# Patient Record
Sex: Female | Born: 1950 | Race: White | Hispanic: No | Marital: Married | Smoking: Current every day smoker
Health system: Southern US, Community
[De-identification: ages and names within clinical notes are randomized; demographics above are authoritative.]

## PROBLEM LIST (undated history)

## (undated) DIAGNOSIS — T7840XA Allergy, unspecified, initial encounter: Secondary | ICD-10-CM

## (undated) DIAGNOSIS — I10 Essential (primary) hypertension: Secondary | ICD-10-CM

## (undated) HISTORY — DX: Essential (primary) hypertension: I10

## (undated) HISTORY — DX: Allergy, unspecified, initial encounter: T78.40XA

---

## 1995-07-11 HISTORY — PX: OTHER SURGICAL HISTORY: SHX169

## 2001-01-17 ENCOUNTER — Other Ambulatory Visit: Admission: RE | Admit: 2001-01-17 | Discharge: 2001-01-17 | Payer: Self-pay | Admitting: Family Medicine

## 2014-01-15 LAB — BASIC METABOLIC PANEL
BUN: 2 mg/dL — AB (ref 4–21)
Creatinine: 0.6 mg/dL (ref 0.5–1.1)
GLUCOSE: 102 mg/dL
POTASSIUM: 3.7 mmol/L (ref 3.4–5.3)
SODIUM: 141 mmol/L (ref 137–147)

## 2014-01-15 LAB — CBC AND DIFFERENTIAL
HCT: 38 % (ref 36–46)
HEMOGLOBIN: 14.1 g/dL (ref 12.0–16.0)
Platelets: 277 10*3/uL (ref 150–399)
WBC: 6.5 10*3/mL

## 2014-01-15 LAB — HEPATIC FUNCTION PANEL
ALT: 10 U/L (ref 7–35)
AST: 13 U/L (ref 13–35)

## 2014-04-05 LAB — HM PAP SMEAR: HM Pap smear: NEGATIVE

## 2014-10-26 DIAGNOSIS — J309 Allergic rhinitis, unspecified: Secondary | ICD-10-CM | POA: Diagnosis not present

## 2014-10-26 DIAGNOSIS — H9192 Unspecified hearing loss, left ear: Secondary | ICD-10-CM | POA: Diagnosis not present

## 2014-11-04 DIAGNOSIS — H6123 Impacted cerumen, bilateral: Secondary | ICD-10-CM | POA: Diagnosis not present

## 2014-11-04 DIAGNOSIS — J309 Allergic rhinitis, unspecified: Secondary | ICD-10-CM | POA: Diagnosis not present

## 2015-04-16 ENCOUNTER — Encounter: Payer: Self-pay | Admitting: Family Medicine

## 2015-07-27 ENCOUNTER — Ambulatory Visit (INDEPENDENT_AMBULATORY_CARE_PROVIDER_SITE_OTHER): Payer: Commercial Managed Care - HMO | Admitting: Family Medicine

## 2015-07-27 ENCOUNTER — Other Ambulatory Visit: Payer: Self-pay | Admitting: Family Medicine

## 2015-07-27 ENCOUNTER — Encounter: Payer: Self-pay | Admitting: Family Medicine

## 2015-07-27 VITALS — BP 132/80 | HR 87 | Temp 97.8°F | Resp 16 | Ht 68.0 in | Wt 152.0 lb

## 2015-07-27 DIAGNOSIS — Z0000 Encounter for general adult medical examination without abnormal findings: Secondary | ICD-10-CM | POA: Diagnosis not present

## 2015-07-27 DIAGNOSIS — F1011 Alcohol abuse, in remission: Secondary | ICD-10-CM | POA: Insufficient documentation

## 2015-07-27 DIAGNOSIS — R5383 Other fatigue: Secondary | ICD-10-CM | POA: Insufficient documentation

## 2015-07-27 DIAGNOSIS — R2231 Localized swelling, mass and lump, right upper limb: Secondary | ICD-10-CM

## 2015-07-27 DIAGNOSIS — L309 Dermatitis, unspecified: Secondary | ICD-10-CM | POA: Insufficient documentation

## 2015-07-27 DIAGNOSIS — K2100 Gastro-esophageal reflux disease with esophagitis, without bleeding: Secondary | ICD-10-CM | POA: Insufficient documentation

## 2015-07-27 DIAGNOSIS — I951 Orthostatic hypotension: Secondary | ICD-10-CM | POA: Insufficient documentation

## 2015-07-27 DIAGNOSIS — I10 Essential (primary) hypertension: Secondary | ICD-10-CM

## 2015-07-27 DIAGNOSIS — Z124 Encounter for screening for malignant neoplasm of cervix: Secondary | ICD-10-CM | POA: Diagnosis not present

## 2015-07-27 DIAGNOSIS — E7800 Pure hypercholesterolemia, unspecified: Secondary | ICD-10-CM | POA: Diagnosis not present

## 2015-07-27 DIAGNOSIS — R000 Tachycardia, unspecified: Secondary | ICD-10-CM | POA: Insufficient documentation

## 2015-07-27 DIAGNOSIS — R748 Abnormal levels of other serum enzymes: Secondary | ICD-10-CM | POA: Insufficient documentation

## 2015-07-27 DIAGNOSIS — R42 Dizziness and giddiness: Secondary | ICD-10-CM | POA: Insufficient documentation

## 2015-07-27 DIAGNOSIS — Z1211 Encounter for screening for malignant neoplasm of colon: Secondary | ICD-10-CM | POA: Diagnosis not present

## 2015-07-27 DIAGNOSIS — F17200 Nicotine dependence, unspecified, uncomplicated: Secondary | ICD-10-CM | POA: Insufficient documentation

## 2015-07-27 DIAGNOSIS — H9192 Unspecified hearing loss, left ear: Secondary | ICD-10-CM | POA: Insufficient documentation

## 2015-07-27 NOTE — Progress Notes (Signed)
Patient ID: Jonay Krzyzaniak, female   DOB: 12-25-1950, 65 y.o.   MRN: [id number]       Patient: Mariselda Crysler, Female    DOB: 1951/03/15, 65 y.o.   MRN: [id number] Visit Date: 07/27/2015  Today's Provider: Margarita Rana, MD   Chief Complaint  Patient presents with  . Medicare Wellness   Subjective:    Annual wellness visit Kaylene Shaddock is a 65 y.o. female. She feels well. She reports exercising none. She reports she is sleeping well.  Lab Results  Component Value Date   WBC 6.5 01/15/2014   HGB 14.1 01/15/2014   HCT 38 01/15/2014   PLT 277 01/15/2014   ALT 10 01/15/2014   AST 13 01/15/2014   NA 141 01/15/2014   K 3.7 01/15/2014   CREATININE 0.6 01/15/2014   BUN 2* 01/15/2014    -----------------------------------------------------------   Review of Systems  Constitutional: Negative.   HENT: Positive for rhinorrhea and sneezing.   Eyes: Negative.   Respiratory: Negative.   Cardiovascular: Negative.   Gastrointestinal: Negative.   Endocrine: Positive for polyuria.  Genitourinary: Negative.   Musculoskeletal: Negative.   Skin: Positive for rash.  Allergic/Immunologic: Positive for environmental allergies.  Neurological: Negative.   Hematological: Negative.   Psychiatric/Behavioral: Negative.     Social History   Social History  . Marital Status: Single    Spouse Name: N/A  . Number of Children: N/A  . Years of Education: N/A   Occupational History  . Not on file.   Social History Main Topics  . Smoking status: Current Every Day Smoker  . Smokeless tobacco: Never Used  . Alcohol Use: 12.6 oz/week    21 Cans of beer per week  . Drug Use: No  . Sexual Activity: Not on file   Other Topics Concern  . Not on file   Social History Narrative    Past Medical History  Diagnosis Date  . Allergy   . Hypertension      Patient Active Problem List   Diagnosis Date Noted  . Dizziness 07/27/2015  . Dermatitis, eczematoid 07/27/2015    . Abnormal liver enzymes 07/27/2015  . Fatigue 07/27/2015  . Esophagitis, reflux 07/27/2015  . H/O alcohol abuse 07/27/2015  . Hearing loss of left ear 07/27/2015  . Hypotension, postural 07/27/2015  . Fast heart beat 07/27/2015  . Compulsive tobacco user syndrome 07/27/2015  . Allergic rhinitis 03/16/2009  . Hypercholesteremia 03/30/2004  . BP (high blood pressure) 08/10/1993    Past Surgical History  Procedure Laterality Date  . Herniated nucleus pulposus  1997    Her family history includes CAD in her father; Cancer in her mother.    Previous Medications   ASPIRIN 81 MG TABLET    Take 1 tablet by mouth daily.   FLUTICASONE (FLONASE) 50 MCG/ACT NASAL SPRAY    Place 2 sprays into the nose as needed.    LORATADINE (CLARITIN) 10 MG TABLET    Take 1 tablet by mouth daily as needed.    METOPROLOL SUCCINATE (TOPROL-XL) 25 MG 24 HR TABLET    Take 1 tablet by mouth daily.   MONTELUKAST (SINGULAIR) 10 MG TABLET    Take 1 tablet by mouth as needed.     Patient Care Team: Margarita Rana, MD as PCP - General (Family Medicine)     Objective:   Vitals: BP 132/80 mmHg  Pulse 87  Temp(Src) 97.8 F (36.6 C) (Oral)  Resp 16  Ht 5\' 8"  (1.727 m)  Wt 152 lb (68.947 kg)  BMI 23.12 kg/m2  SpO2 97%  Physical Exam  Constitutional: She is oriented to person, place, and time. She appears well-developed and well-nourished.  HENT:  Head: Normocephalic and atraumatic.  Right Ear: Tympanic membrane, external ear and ear canal normal.  Left Ear: Tympanic membrane, external ear and ear canal normal.  Nose: Mucosal edema and rhinorrhea present.  Mouth/Throat: Uvula is midline, oropharynx is clear and moist and mucous membranes are normal.  Eyes: Conjunctivae, EOM and lids are normal. Pupils are equal, round, and reactive to light.  Neck: Trachea normal and normal range of motion. Neck supple. Carotid bruit is not present. No thyroid mass and no thyromegaly present.  Cardiovascular: Normal  rate, regular rhythm and normal heart sounds.   Pulmonary/Chest: Effort normal and breath sounds normal.  Abdominal: Soft. Normal appearance and bowel sounds are normal. There is no hepatosplenomegaly. There is no tenderness.  Genitourinary: No breast swelling, tenderness or discharge.  Musculoskeletal: Normal range of motion.       Right elbow: She exhibits swelling.  2 cm firm nodule  Lymphadenopathy:    She has no cervical adenopathy.    She has no axillary adenopathy.  Neurological: She is alert and oriented to person, place, and time. She has normal strength. No cranial nerve deficit.  Skin: Skin is warm, dry and intact. Rash noted.  Senile purpura  Psychiatric: She has a normal mood and affect. Her speech is normal and behavior is normal. Judgment and thought content normal. Cognition and memory are normal.    Activities of Daily Living In your present state of health, do you have any difficulty performing the following activities: 07/27/2015  Hearing? N  Vision? N  Difficulty concentrating or making decisions? N  Walking or climbing stairs? Y  Dressing or bathing? N  Doing errands, shopping? N    Fall Risk Assessment Fall Risk  07/27/2015  Falls in the past year? No     Depression Screen PHQ 2/9 Scores 07/27/2015  PHQ - 2 Score 0    Cognitive Testing - 6-CIT  Correct? Score   What year is it? yes 0 0 or 4  What month is it? yes 0 0 or 3  Memorize:    Pia Mau,  40,  [address]  [place],      What time is it? (within 1 hour) yes 0 0 or 3  Count backwards from 20 yes 0 0, 2, or 4  Name the months of the year yes 0 0, 2, or 4  Repeat name & address above yes 2 0, 2, 4, 6, 8, or 10       TOTAL SCORE  2/28   Interpretation:  Normal  Normal (0-7) Abnormal (8-28)       Assessment & Plan:     Annual Wellness Visit  Reviewed patient's Family Medical History Reviewed and updated list of patient's medical providers Assessment of cognitive impairment was  done Assessed patient's functional ability Established a written schedule for health screening [place] Completed and Reviewed  Exercise Activities and Dietary recommendations Goals    . Exercise 150 minutes per week (moderate activity)       1. Initial Medicare annual wellness visit Exam done today. EKG with no acute changes.  Has multiple lifestyle issues that encouraged her to address today, especially ETOH and tobacco. Also refused all vaccines today.  - EKG 12-Lead  2. Essential hypertension Condition is stable. Please continue current  medication and  plan of care as noted.   - CBC with Differential/Platelet - Comprehensive metabolic panel  3. Hypercholesteremia Will check labs.   - Lipid Panel With LDL/HDL Ratio - TSH  4. Cervical cancer screening Done today.  - Pap IG (Image Guided)  5. Colon cancer screening Refused colonoscopy but did agree to cologard.  - Cologuard  6. Skin lump of arm, right Slightly firm and irregular for simple cyst or bursitis. Will refer for further evaluation.   - Ambulatory referral to General Surgery   Patient was seen and examined by Jerrell Belfast, MD, and note scribed by Lynford Humphrey, [place].   I have reviewed the document for accuracy and completeness and I agree with above. Jerrell Belfast, MD   Margarita Rana, MD    ------------------------------------------------------------------------------------------------------------

## 2015-07-28 ENCOUNTER — Encounter: Payer: Self-pay | Admitting: *Deleted

## 2015-07-30 ENCOUNTER — Telehealth: Payer: Self-pay

## 2015-07-30 LAB — PAP IG (IMAGE GUIDED): PAP SMEAR COMMENT: 0

## 2015-07-30 NOTE — Telephone Encounter (Signed)
Pt advised.   Thanks,   -Dianara Smullen  

## 2015-07-30 NOTE — Telephone Encounter (Signed)
-----   Message from Margarita Rana, MD sent at 07/30/2015  1:44 PM EST ----- Pap is normal. Please notify patient.

## 2015-08-10 ENCOUNTER — Ambulatory Visit: Payer: Self-pay | Admitting: General Surgery

## 2015-08-16 ENCOUNTER — Ambulatory Visit (INDEPENDENT_AMBULATORY_CARE_PROVIDER_SITE_OTHER): Payer: Commercial Managed Care - HMO | Admitting: General Surgery

## 2015-08-16 ENCOUNTER — Encounter: Payer: Self-pay | Admitting: General Surgery

## 2015-08-16 VITALS — BP 128/72 | HR 76 | Resp 14 | Ht 68.0 in | Wt 148.0 lb

## 2015-08-16 DIAGNOSIS — M71321 Other bursal cyst, right elbow: Secondary | ICD-10-CM | POA: Diagnosis not present

## 2015-08-16 NOTE — Progress Notes (Signed)
Patient ID: Patricia Casey, female   DOB: 09/29/1950, 65 y.o.   MRN: [id number]  Chief Complaint  Patient presents with  . Other    lump on arm    HPI Patricia Casey is a 65 y.o. female here today for an evaluation of a lump on right elbow. Patient states the lump has been present for 3 months and increased in size for the first 2 months, none since then.. She noticed the area started out about a pea size. Denies injury to the area. No pain/tenderness. She does rest her elbow on her computer desk.  HPI  Past Medical History  Diagnosis Date  . Allergy   . Hypertension     Past Surgical History  Procedure Laterality Date  . Herniated nucleus pulposus  1997    Back surgery     Family History  Problem Relation Age of Onset  . Cancer Mother     Pancreatitic  . CAD Father     Social History Social History  Substance Use Topics  . Smoking status: Current Every Day Smoker  . Smokeless tobacco: Never Used  . Alcohol Use: 12.6 oz/week    21 Cans of beer per week    No Known Allergies  Current Outpatient Prescriptions  Medication Sig Dispense Refill  . aspirin 81 MG tablet Take 1 tablet by mouth daily.    . fluticasone (FLONASE) 50 MCG/ACT nasal spray Place 2 sprays into the nose daily.     Marland Kitchen loratadine (CLARITIN) 10 MG tablet Take 1 tablet by mouth daily as needed.     . metoprolol succinate (TOPROL-XL) 25 MG 24 hr tablet TAKE 1 TABLET EVERY DAY 90 tablet 1  . montelukast (SINGULAIR) 10 MG tablet Take 1 tablet by mouth as needed.      No current facility-administered medications for this visit.    Review of Systems Review of Systems  Constitutional: Negative.   Respiratory: Negative.   Cardiovascular: Negative.     Blood pressure 128/72, pulse 76, resp. rate 14, height 5\' 8"  (1.727 m), weight 148 lb (67.132 kg).  Physical Exam Physical Exam  Constitutional: She is oriented to person, place, and time. She appears well-developed and well-nourished.  Eyes:  Conjunctivae are normal. No scleral icterus.  Neck: Neck supple. No thyromegaly present.  Cardiovascular: Normal rate, regular rhythm and normal heart sounds.   Pulmonary/Chest: Effort normal and breath sounds normal.  Musculoskeletal:       Arms:  2 separate masses 1 1/2 x 2 1/2  and a 3 x 3 mass present right elbow    Lymphadenopathy:    She has no cervical adenopathy.  Neurological: She is alert and oriented to person, place, and time.  Skin: Skin is warm and dry.      Assessment    Likely cystic mass, possibly related to the underlying bursa.    Plan    The patient reports the area has not increased in size in the past month and is only symptomatic when she rests her elbow on the counter. Options at this time include observation versus excision.   Patient advised follow up in 3 months and  if it increases in size call the office.       This information has been scribed by Verlene Mayer, CMA   PCP: Dr. Illa Level, [place] 08/17/2015, 12:30 PM

## 2015-08-16 NOTE — Patient Instructions (Signed)
Follow up in 3 months and  if it increases in size call the office sooner.

## 2015-09-20 ENCOUNTER — Other Ambulatory Visit: Payer: Self-pay | Admitting: Family Medicine

## 2015-09-20 DIAGNOSIS — J3089 Other allergic rhinitis: Secondary | ICD-10-CM

## 2015-11-15 ENCOUNTER — Ambulatory Visit: Payer: Self-pay | Admitting: General Surgery

## 2015-11-25 ENCOUNTER — Other Ambulatory Visit: Payer: Self-pay | Admitting: Emergency Medicine

## 2015-11-25 ENCOUNTER — Ambulatory Visit (INDEPENDENT_AMBULATORY_CARE_PROVIDER_SITE_OTHER): Payer: Commercial Managed Care - HMO | Admitting: General Surgery

## 2015-11-25 ENCOUNTER — Encounter: Payer: Self-pay | Admitting: General Surgery

## 2015-11-25 VITALS — BP 140/70 | HR 74 | Resp 14 | Ht 68.0 in | Wt 151.0 lb

## 2015-11-25 DIAGNOSIS — M71321 Other bursal cyst, right elbow: Secondary | ICD-10-CM

## 2015-11-25 DIAGNOSIS — I10 Essential (primary) hypertension: Secondary | ICD-10-CM

## 2015-11-25 MED ORDER — METOPROLOL SUCCINATE ER 25 MG PO TB24: 25.0000 mg | ORAL_TABLET | Freq: Every day | ORAL | Status: DC

## 2015-11-25 NOTE — Progress Notes (Signed)
Patient ID: Patricia Casey, female   DOB: 1951/01/24, 65 y.o.   MRN: [id number]  Chief Complaint  Patient presents with  . Follow-up    skin cyst    HPI Patricia Casey is a 65 y.o. female here following up for a skin cyst of her right arm. She reports that the area has not changed. She reports that it is still very sore to touch.   No difficulty with right upper extremity range of motion.  I personal review the patient's history. HPI  Past Medical History  Diagnosis Date  . Allergy   . Hypertension     Past Surgical History  Procedure Laterality Date  . Herniated nucleus pulposus  1997    Back surgery     Family History  Problem Relation Age of Onset  . Cancer Mother     Pancreatitic  . CAD Father     Social History Social History  Substance Use Topics  . Smoking status: Current Every Day Smoker  . Smokeless tobacco: Never Used  . Alcohol Use: 12.6 oz/week    21 Cans of beer per week    No Known Allergies  Current Outpatient Prescriptions  Medication Sig Dispense Refill  . aspirin 81 MG tablet Take 1 tablet by mouth daily.    . fluticasone (FLONASE) 50 MCG/ACT nasal spray Place 2 sprays into the nose daily.     Marland Kitchen loratadine (CLARITIN) 10 MG tablet Take 1 tablet by mouth daily as needed.     . montelukast (SINGULAIR) 10 MG tablet TAKE 1 TABLET EVERY DAY 90 tablet 3  . metoprolol succinate (TOPROL-XL) 25 MG 24 hr tablet Take 1 tablet (25 mg total) by mouth daily. 30 tablet 0   No current facility-administered medications for this visit.    Review of Systems Review of Systems  Constitutional: Negative.   Respiratory: Negative.   Cardiovascular: Negative.     Blood pressure 140/70, pulse 74, resp. rate 14, height 5\' 8"  (1.727 m), weight 151 lb (68.493 kg).  Physical Exam Physical Exam  Constitutional: She is oriented to person, place, and time. She appears well-developed and well-nourished.  Eyes: Conjunctivae are normal. No scleral icterus.   Neck: Neck supple.  Cardiovascular: Normal rate, regular rhythm and normal heart sounds.   Pulmonary/Chest: Effort normal and breath sounds normal.  Musculoskeletal:       Arms: Lymphadenopathy:    She has no cervical adenopathy.    She has no axillary adenopathy.  Neurological: She is alert and oriented to person, place, and time.  Skin: Skin is warm and dry.  4 x 5 mass right elbow  Psychiatric: She has a normal mood and affect.    Data Reviewed At the time of her previous exam there appeared to be 2 discrete masses 3 x 3 and 1.5 x 1.5 cm.  Assessment    Bursa mass versus sebaceous cyst of the posterior elbow.    Plan    Due to the persistent discomfort patient has elected to proceed to excision. The procedure was reviewed. This be scheduled a convenient date.    PCP:  Margarita Rana This has been scribed by Lesly Rubenstein LPN    Robert Bellow 11/27/2015, 10:20 AM

## 2015-11-25 NOTE — Telephone Encounter (Signed)
Pt husband called and would like to know if pt can get a 2 week supply on Metoprolol succ. 25 mg because she thought that she had another bottle at home and she did not. She ordered her refill through mail order and will not get it for about 2 weeks. She is completely out and missed yesterday as well.     [facility] [address]

## 2015-11-25 NOTE — Telephone Encounter (Signed)
Please advise. Patricia Casey, CMA  

## 2015-12-07 ENCOUNTER — Encounter: Payer: Self-pay | Admitting: General Surgery

## 2015-12-07 ENCOUNTER — Ambulatory Visit (INDEPENDENT_AMBULATORY_CARE_PROVIDER_SITE_OTHER): Payer: Commercial Managed Care - HMO | Admitting: General Surgery

## 2015-12-07 VITALS — BP 118/66 | HR 70 | Resp 12 | Ht 68.0 in | Wt 150.0 lb

## 2015-12-07 DIAGNOSIS — C801 Malignant (primary) neoplasm, unspecified: Secondary | ICD-10-CM | POA: Diagnosis not present

## 2015-12-07 DIAGNOSIS — D492 Neoplasm of unspecified behavior of bone, soft tissue, and skin: Secondary | ICD-10-CM | POA: Diagnosis not present

## 2015-12-07 DIAGNOSIS — M71321 Other bursal cyst, right elbow: Secondary | ICD-10-CM

## 2015-12-07 DIAGNOSIS — C7641 Malignant neoplasm of right upper limb: Secondary | ICD-10-CM

## 2015-12-07 MED ORDER — HYDROCODONE-ACETAMINOPHEN 5-325 MG PO TABS: 1.0000 | ORAL_TABLET | ORAL | Status: DC | PRN

## 2015-12-07 NOTE — Patient Instructions (Addendum)
The patient is aware to call back for any questions or concerns. Ice pack to the area for comfort today Leave dressing in place for 2 days Shower after wrap is removed keep are clean Return for suture removal

## 2015-12-07 NOTE — Progress Notes (Signed)
Patient ID: Patricia Casey, female   DOB: 01/19/1951, 65 y.o.   MRN: [id number]  Chief Complaint  Patient presents with  . Procedure    excision right elbow mass    HPI Patricia Casey is a 65 y.o. female.  Here today for excision left elbow mass.  HPI  Past Medical History  Diagnosis Date  . Allergy   . Hypertension     Past Surgical History  Procedure Laterality Date  . Herniated nucleus pulposus  1997    Back surgery     Family History  Problem Relation Age of Onset  . Cancer Mother     Pancreatitic  . CAD Father     Social History Social History  Substance Use Topics  . Smoking status: Current Every Day Smoker  . Smokeless tobacco: Never Used  . Alcohol Use: 12.6 oz/week    21 Cans of beer per week    No Known Allergies  Current Outpatient Prescriptions  Medication Sig Dispense Refill  . aspirin 81 MG tablet Take 1 tablet by mouth daily.    . fluticasone (FLONASE) 50 MCG/ACT nasal spray Place 2 sprays into the nose daily.     Marland Kitchen loratadine (CLARITIN) 10 MG tablet Take 1 tablet by mouth daily as needed.     . metoprolol succinate (TOPROL-XL) 25 MG 24 hr tablet Take 1 tablet (25 mg total) by mouth daily. 30 tablet 0  . montelukast (SINGULAIR) 10 MG tablet TAKE 1 TABLET EVERY DAY 90 tablet 3  . HYDROcodone-acetaminophen (NORCO) 5-325 MG tablet Take 1-2 tablets by mouth every 4 (four) hours as needed for moderate pain. 20 tablet 0   No current facility-administered medications for this visit.    Review of Systems Review of Systems  Constitutional: Negative.   Respiratory: Negative.   Cardiovascular: Negative.     Blood pressure 118/66, pulse 70, resp. rate 12, height 5\' 8"  (1.727 m), weight 150 lb (68.04 kg).  Physical Exam Physical Exam  Constitutional: She is oriented to person, place, and time. She appears well-developed and well-nourished.  Musculoskeletal:       Arms: Neurological: She is alert and oriented to person, place, and time.   Skin: Skin is warm and dry.  Psychiatric: Her behavior is normal.      Assessment    Enlarging, symptomatic mass involving the left posterior elbow.    Plan    The area was cleansed with ChloraPrep. 20 mL of 0.5% Xylocaine with 0.25% Marcaine with 1-200,000 of epinephrine was utilized well tolerated. ChloraPrep was applied to the skin. A transverse incision was made over the mass which extended to the base of the dermis. The vast majority of the mass could be enucleated. Scant bleeding was noted. The deep tissue was approximately with interrupted 3-0 Vicryl sutures. The skin closed with a running 4-0 nylon suture. Telfa and Tegaderm dressing applied. Fluff gauze, Kerlix and a light Coban dressing placed.  Patient was instructed in regards to wound care.  Follow up in one week for nursing exam with physician oversight.     PCP:  Margarita Rana This information has been scribed by Karie Fetch RN, BSN,BC.   Robert Bellow 12/08/2015, 9:29 PM

## 2015-12-10 ENCOUNTER — Telehealth: Payer: Self-pay | Admitting: General Surgery

## 2015-12-10 NOTE — Telephone Encounter (Signed)
Message that the final report would not be out until Monday left on the patient's home answering machine

## 2015-12-13 ENCOUNTER — Ambulatory Visit: Payer: Self-pay

## 2015-12-14 ENCOUNTER — Ambulatory Visit (INDEPENDENT_AMBULATORY_CARE_PROVIDER_SITE_OTHER): Payer: Commercial Managed Care - HMO | Admitting: *Deleted

## 2015-12-14 DIAGNOSIS — M71321 Other bursal cyst, right elbow: Secondary | ICD-10-CM

## 2015-12-14 NOTE — Patient Instructions (Signed)
The patient is aware to call back for any questions or concerns.  

## 2015-12-14 NOTE — Progress Notes (Signed)
Patient ID: Patricia Casey, female   DOB: Jan 08, 1951, 65 y.o.   MRN: [id number]  Patient came in today for a wound check post excision.  The wound is clean, with no signs of infection noted. Sutures removed, steri stripes applied. Minimal bruising noted. Follow up as scheduled.

## 2015-12-16 ENCOUNTER — Encounter: Payer: Self-pay | Admitting: *Deleted

## 2015-12-20 ENCOUNTER — Encounter: Payer: Self-pay | Admitting: General Surgery

## 2015-12-20 ENCOUNTER — Ambulatory Visit (INDEPENDENT_AMBULATORY_CARE_PROVIDER_SITE_OTHER): Payer: Commercial Managed Care - HMO | Admitting: General Surgery

## 2015-12-20 ENCOUNTER — Telehealth: Payer: Self-pay | Admitting: *Deleted

## 2015-12-20 VITALS — BP 130/74 | HR 88 | Resp 14 | Ht 68.0 in | Wt 148.0 lb

## 2015-12-20 DIAGNOSIS — C801 Malignant (primary) neoplasm, unspecified: Secondary | ICD-10-CM

## 2015-12-20 NOTE — Progress Notes (Addendum)
Patient ID: Patricia Casey, female   DOB: May 02, 1951, 65 y.o.   MRN: [id number]  Chief Complaint  Patient presents with  . Follow-up    HPI Patricia Casey is a 65 y.o. female here today for her follow up for a right elbow excision done on 12/07/15. Patient states she is doing well. She states that it is still tender. No limitation of elbow function.  I personally reviewed the patient's history. HPI  Past Medical History  Diagnosis Date  . Allergy   . Hypertension     Past Surgical History  Procedure Laterality Date  . Herniated nucleus pulposus  1997    Back surgery     Family History  Problem Relation Age of Onset  . Cancer Mother     Pancreatitic  . CAD Father     Social History Social History  Substance Use Topics  . Smoking status: Current Every Day Smoker  . Smokeless tobacco: Never Used  . Alcohol Use: 12.6 oz/week    21 Cans of beer per week    No Known Allergies  Current Outpatient Prescriptions  Medication Sig Dispense Refill  . aspirin 81 MG tablet Take 1 tablet by mouth daily.    . fluticasone (FLONASE) 50 MCG/ACT nasal spray Place 2 sprays into the nose daily.     Marland Kitchen HYDROcodone-acetaminophen (NORCO) 5-325 MG tablet Take 1-2 tablets by mouth every 4 (four) hours as needed for moderate pain. 20 tablet 0  . loratadine (CLARITIN) 10 MG tablet Take 1 tablet by mouth daily as needed.     . metoprolol succinate (TOPROL-XL) 25 MG 24 hr tablet Take 1 tablet (25 mg total) by mouth daily. 30 tablet 0  . montelukast (SINGULAIR) 10 MG tablet TAKE 1 TABLET EVERY DAY 90 tablet 3   No current facility-administered medications for this visit.    Review of Systems Review of Systems  Constitutional: Negative.   Respiratory: Negative.   Cardiovascular: Negative.     Blood pressure 130/74, pulse 88, resp. rate 14, height 5\' 8"  (1.727 m), weight 148 lb (67.132 kg).  Physical Exam Physical Exam  Constitutional: She is oriented to person, place, and time.  She appears well-developed and well-nourished.  Eyes: Conjunctivae are normal. No scleral icterus.  Neck: Neck supple.  Cardiovascular: Normal rate, regular rhythm and normal heart sounds.   Pulmonary/Chest: Effort normal and breath sounds normal.  Lymphadenopathy:    She has no cervical adenopathy.  Neurological: She is alert and oriented to person, place, and time.  Skin: Skin is warm and dry.  Right elbow incision site is clean and healing well.  Data Reviewed PLEOMORPHIC SPINDLE CELL NEOPLASM,  I spoke with the pathologist. The invasion of the adipose tissue suggestive this is a malignant process.  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    Plan    We'll undertake a literature review. She is likely going need formal excision to clear margins. In this location this may be best handled by a soft tissue surgical oncologist.    Follow up appointment to be announced. PCP:  Margarita Rana  This information has been scribed by Gaspar Cola CMA.    Robert Bellow 12/20/2015, 9:27 PM   Patient called to report that she would like to be seen at [facility]. Will arrange for assessment with:   Kandis Nab, MD  Associate Professor of Orthopaedics  Program Director - [place] and Orthopaedic Surgery  Orthopaedic  Oncology

## 2015-12-20 NOTE — Patient Instructions (Addendum)
Follow up appointment to be announced.  

## 2015-12-20 NOTE — Telephone Encounter (Signed)
Patient called and wanted to let you know that she wants to go to [facility]

## 2015-12-23 ENCOUNTER — Encounter: Payer: Self-pay | Admitting: *Deleted

## 2015-12-23 NOTE — Progress Notes (Signed)
Patient ID: Patricia Casey, female   DOB: 25-Feb-1951, 65 y.o.   MRN: [id number]  Per Kristin Bruins at [facility], patient has been scheduled for an appointment with Dr. Junie Spencer for 01-25-16 at 9:30 am (evaluation for full surgical excision of area on right elbow, C80.1).   Message left for Judson Roch at [facility] with the above information since she is working on a paper referral due to patient having [employer].

## 2016-01-25 DIAGNOSIS — R2231 Localized swelling, mass and lump, right upper limb: Secondary | ICD-10-CM | POA: Diagnosis not present

## 2016-02-01 DIAGNOSIS — R2231 Localized swelling, mass and lump, right upper limb: Secondary | ICD-10-CM | POA: Diagnosis not present

## 2016-02-04 DIAGNOSIS — F17210 Nicotine dependence, cigarettes, uncomplicated: Secondary | ICD-10-CM | POA: Diagnosis not present

## 2016-02-04 DIAGNOSIS — C4499 Other specified malignant neoplasm of skin, unspecified: Secondary | ICD-10-CM | POA: Insufficient documentation

## 2016-02-04 DIAGNOSIS — R609 Edema, unspecified: Secondary | ICD-10-CM | POA: Diagnosis not present

## 2016-02-04 DIAGNOSIS — Z7982 Long term (current) use of aspirin: Secondary | ICD-10-CM | POA: Diagnosis not present

## 2016-02-04 DIAGNOSIS — Z79899 Other long term (current) drug therapy: Secondary | ICD-10-CM | POA: Diagnosis not present

## 2016-02-04 DIAGNOSIS — I10 Essential (primary) hypertension: Secondary | ICD-10-CM | POA: Diagnosis not present

## 2016-02-04 DIAGNOSIS — C4911 Malignant neoplasm of connective and soft tissue of right upper limb, including shoulder: Secondary | ICD-10-CM | POA: Diagnosis not present

## 2016-02-11 DIAGNOSIS — R911 Solitary pulmonary nodule: Secondary | ICD-10-CM | POA: Diagnosis not present

## 2016-02-11 DIAGNOSIS — C4912 Malignant neoplasm of connective and soft tissue of left upper limb, including shoulder: Secondary | ICD-10-CM | POA: Diagnosis not present

## 2016-02-11 DIAGNOSIS — R2231 Localized swelling, mass and lump, right upper limb: Secondary | ICD-10-CM | POA: Diagnosis not present

## 2016-02-11 DIAGNOSIS — R918 Other nonspecific abnormal finding of lung field: Secondary | ICD-10-CM | POA: Diagnosis not present

## 2016-02-11 DIAGNOSIS — J439 Emphysema, unspecified: Secondary | ICD-10-CM | POA: Diagnosis not present

## 2016-02-14 ENCOUNTER — Other Ambulatory Visit: Payer: Self-pay | Admitting: Family Medicine

## 2016-02-14 DIAGNOSIS — I10 Essential (primary) hypertension: Secondary | ICD-10-CM

## 2016-02-18 ENCOUNTER — Encounter: Payer: Self-pay | Admitting: Radiation Oncology

## 2016-02-18 ENCOUNTER — Encounter (INDEPENDENT_AMBULATORY_CARE_PROVIDER_SITE_OTHER): Payer: Self-pay

## 2016-02-18 ENCOUNTER — Ambulatory Visit
Admission: RE | Admit: 2016-02-18 | Discharge: 2016-02-18 | Disposition: A | Discharge status: Home / Self Care | Payer: Commercial Managed Care - HMO | Source: Ambulatory Visit | Attending: Radiation Oncology | Admitting: Radiation Oncology

## 2016-02-18 VITALS — BP 157/90 | HR 92 | Temp 95.7°F | Resp 20 | Wt 146.3 lb

## 2016-02-18 DIAGNOSIS — Z87891 Personal history of nicotine dependence: Secondary | ICD-10-CM | POA: Insufficient documentation

## 2016-02-18 DIAGNOSIS — I10 Essential (primary) hypertension: Secondary | ICD-10-CM | POA: Insufficient documentation

## 2016-02-18 DIAGNOSIS — Z510 Encounter for antineoplastic radiation therapy: Secondary | ICD-10-CM | POA: Insufficient documentation

## 2016-02-18 DIAGNOSIS — Z7982 Long term (current) use of aspirin: Secondary | ICD-10-CM | POA: Diagnosis not present

## 2016-02-18 DIAGNOSIS — C4499 Other specified malignant neoplasm of skin, unspecified: Secondary | ICD-10-CM | POA: Diagnosis not present

## 2016-02-18 DIAGNOSIS — Z809 Family history of malignant neoplasm, unspecified: Secondary | ICD-10-CM | POA: Insufficient documentation

## 2016-02-18 DIAGNOSIS — C4911 Malignant neoplasm of connective and soft tissue of right upper limb, including shoulder: Secondary | ICD-10-CM | POA: Insufficient documentation

## 2016-02-18 DIAGNOSIS — Z79899 Other long term (current) drug therapy: Secondary | ICD-10-CM | POA: Diagnosis not present

## 2016-02-18 NOTE — Consult Note (Signed)
Except an outstanding is perfect of Radiation Oncology NEW PATIENT EVALUATION  Name: Patricia Casey  MRN: [id number]  Date:   02/18/2016     DOB: 08-11-1950   This 65 y.o. 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  REFERRING PHYSICIAN: Mar Daring, [place]*  CHIEF COMPLAINT:  Chief Complaint  Patient presents with  . 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   PREVIOUS INVESTIGATIONS:  MRI scan of elbow and CT scan of chest reviewed Clinical notes reviewed Pathology report reviewed  HPI: Patient is a 65 year old female presented with a somewhat painful nodule on her right elbow which was brought to the attention of surgeon. Surgeon performed biopsy which showed high-grade spindle cell sarcoma. Upon review at [facility] this was thought to be a myxofibrosarcoma grade 2-3.Marland Kitchen She was seen by orthopedic surgery MRI scan was performed showing a T2 intense mildly enhancing mass within the superficial soft tissues of the left distal humerus measuring 1.7 x 1.4 x 1 point 0.0 cm. CT scan of the chest was also performed showing an 8 mm groundglass opacity in the right upper lobe and a 3 mm nodule both indeterminate and follow-up chest CT scans in 3 months were recommended. She has been seen by orthopedic surgery and his could be scheduled for resection and I been asked to evaluate the patient for preoperative radiation therapy. She is doing well she does have trouble ambulating of uncertain etiology. She states the elbow is still somewhat tender. She does have some slight limitation of motion of her elbow also. She specifically denies any bone pain cough.  PLANNED TREATMENT REGIMEN: Preop radiation therapy  PAST MEDICAL HISTORY:  has a past medical history of Allergy and Hypertension.    PAST  SURGICAL HISTORY:  Past Surgical History:  Procedure Laterality Date  . HERNIATED NUCLEUS PULPOSUS  1997   Back surgery     FAMILY HISTORY: family history includes CAD in her father; Cancer in her mother.  SOCIAL HISTORY:  reports that she has been smoking Cigarettes.  She has a 75.00 pack-year smoking history. She has never used smokeless tobacco. She reports that she drinks about 12.6 oz of alcohol per week . She reports that she does not use drugs.  ALLERGIES: Review of patient's allergies indicates no known allergies.  MEDICATIONS:  Current Outpatient Prescriptions  Medication Sig Dispense Refill  . aspirin 81 MG tablet Take 1 tablet by mouth daily.    . Cyanocobalamin (RA VITAMIN B-12 TR) 1000 MCG TBCR Take by mouth.    . fluticasone (FLONASE) 50 MCG/ACT nasal spray Place 2 sprays into the nose daily.     . metoprolol succinate (TOPROL-XL) 25 MG 24 hr tablet TAKE 1 TABLET EVERY DAY 90 tablet 1  . montelukast (SINGULAIR) 10 MG tablet TAKE 1 TABLET EVERY DAY 90 tablet 3  . HYDROcodone-acetaminophen (NORCO) 5-325 MG tablet Take 1-2 tablets by mouth every 4 (four) hours as needed for moderate pain. (Patient not taking: Reported on 02/18/2016) 20 tablet 0  . loratadine (CLARITIN) 10 MG tablet Take 1 tablet by mouth daily as needed.      No current facility-administered medications for this encounter.     ECOG PERFORMANCE STATUS:  1 - Symptomatic but completely ambulatory  REVIEW OF SYSTEMS: Except for the pain of  her right elbow and difficulty ambulating Patient denies any weight loss, fatigue, weakness, fever, chills or night sweats. Patient denies any loss of vision, blurred vision. Patient denies any ringing  of the ears or hearing loss. No irregular heartbeat. Patient denies heart murmur or history of fainting. Patient denies any chest pain or pain radiating to her upper extremities. Patient denies any shortness of breath, difficulty breathing at night, cough or hemoptysis. Patient  denies any swelling in the lower legs. Patient denies any nausea vomiting, vomiting of blood, or coffee ground material in the vomitus. Patient denies any stomach pain. Patient states has had normal bowel movements no significant constipation or diarrhea. Patient denies any dysuria, hematuria or significant nocturia. Patient denies any problems walking, swelling in the joints or loss of balance. Patient denies any skin changes, loss of hair or loss of weight. Patient denies any excessive worrying or anxiety or significant depression. Patient denies any problems with insomnia. Patient denies excessive thirst, polyuria, polydipsia. Patient denies any swollen glands, patient denies easy bruising or easy bleeding. Patient denies any recent infections, allergies or URI. Patient "s visual fields have not changed significantly in recent time.    PHYSICAL EXAM: BP (!) 157/90   Pulse 92   Temp (!) 95.7 F (35.4 C)   Resp 20   Wt 146 lb 4.4 oz (66.3 kg)   BMI 22.24 kg/m  Patient has a approximately 2 cm mass fairly fixed and subcutaneous tissue in the medial portion of the right elbow. No evidence of axillary or supraclavicular clavicular adenopathy is identified. Well-developed well-nourished patient in NAD. HEENT reveals PERLA, EOMI, discs not visualized.  Oral cavity is clear. No oral mucosal lesions are identified. Neck is clear without evidence of cervical or supraclavicular adenopathy. Lungs are clear to A&P. Cardiac examination is essentially unremarkable with regular rate and rhythm without murmur rub or thrill. Abdomen is benign with no organomegaly or masses noted. Motor sensory and DTR levels are equal and symmetric in the upper and lower extremities. Cranial nerves II through XII are grossly intact. Proprioception is intact. No peripheral adenopathy or edema is identified. No motor or sensory levels are noted. Crude visual fields are within normal range.  LABORATORY DATA: Pathology reports  reviewed    RADIOLOGY RESULTS: MRI and plain films of the elbow as well CT scan of the chest all reviewed and compatible with the above-stated findings   IMPRESSION: Grade IIa myxofibrosarcoma of the right elbow for preoperative radiation therapy in 65 year old female  PLAN: At this time I agree with postoperative radiation therapy to her right elbow prior to resection. I would plan on delivering 5000 cGy over 5 weeks. Will follow RT G guidelines as far as margins for this extremity sarcoma. I would also reevaluate her after completion of surgery and possible offer further radiation therapy should be a positive margin or indeterminate margin. Risks and benefits of treatment including skin reaction fatigue some increased soreness in her right elbow all were discussed in detail with the patient and her sister. They both seem to comprehend my treatment plan well. I have personally set her up and ordered CT simulation for early next week. We'll contact her orthopedic surgeon at [facility] about 2 weeks prior to her completion of radiation to they can begin scheduling follow-up and surgery.  I would like to take this opportunity to thank you for allowing me to participate in the care of your patient.Armstead Peaks., MD

## 2016-02-22 ENCOUNTER — Ambulatory Visit
Admission: RE | Admit: 2016-02-22 | Discharge: 2016-02-22 | Disposition: A | Discharge status: Home / Self Care | Payer: Commercial Managed Care - HMO | Source: Ambulatory Visit | Attending: Radiation Oncology | Admitting: Radiation Oncology

## 2016-02-22 DIAGNOSIS — C4911 Malignant neoplasm of connective and soft tissue of right upper limb, including shoulder: Secondary | ICD-10-CM | POA: Diagnosis not present

## 2016-02-22 DIAGNOSIS — C4499 Other specified malignant neoplasm of skin, unspecified: Secondary | ICD-10-CM | POA: Diagnosis not present

## 2016-02-22 DIAGNOSIS — I10 Essential (primary) hypertension: Secondary | ICD-10-CM | POA: Diagnosis not present

## 2016-02-22 DIAGNOSIS — Z7982 Long term (current) use of aspirin: Secondary | ICD-10-CM | POA: Diagnosis not present

## 2016-02-22 DIAGNOSIS — Z87891 Personal history of nicotine dependence: Secondary | ICD-10-CM | POA: Diagnosis not present

## 2016-02-22 DIAGNOSIS — Z809 Family history of malignant neoplasm, unspecified: Secondary | ICD-10-CM | POA: Diagnosis not present

## 2016-02-22 DIAGNOSIS — Z510 Encounter for antineoplastic radiation therapy: Secondary | ICD-10-CM | POA: Diagnosis not present

## 2016-02-22 DIAGNOSIS — Z79899 Other long term (current) drug therapy: Secondary | ICD-10-CM | POA: Diagnosis not present

## 2016-02-28 ENCOUNTER — Ambulatory Visit
Admission: RE | Admit: 2016-02-28 | Discharge: 2016-02-28 | Disposition: A | Discharge status: Home / Self Care | Payer: Commercial Managed Care - HMO | Source: Ambulatory Visit | Attending: Radiation Oncology | Admitting: Radiation Oncology

## 2016-02-29 ENCOUNTER — Ambulatory Visit: Payer: Commercial Managed Care - HMO

## 2016-02-29 DIAGNOSIS — C4499 Other specified malignant neoplasm of skin, unspecified: Secondary | ICD-10-CM | POA: Diagnosis not present

## 2016-02-29 DIAGNOSIS — Z7982 Long term (current) use of aspirin: Secondary | ICD-10-CM | POA: Diagnosis not present

## 2016-02-29 DIAGNOSIS — Z87891 Personal history of nicotine dependence: Secondary | ICD-10-CM | POA: Diagnosis not present

## 2016-02-29 DIAGNOSIS — Z809 Family history of malignant neoplasm, unspecified: Secondary | ICD-10-CM | POA: Diagnosis not present

## 2016-02-29 DIAGNOSIS — C4911 Malignant neoplasm of connective and soft tissue of right upper limb, including shoulder: Secondary | ICD-10-CM | POA: Diagnosis not present

## 2016-02-29 DIAGNOSIS — Z510 Encounter for antineoplastic radiation therapy: Secondary | ICD-10-CM | POA: Diagnosis not present

## 2016-02-29 DIAGNOSIS — Z79899 Other long term (current) drug therapy: Secondary | ICD-10-CM | POA: Diagnosis not present

## 2016-02-29 DIAGNOSIS — I10 Essential (primary) hypertension: Secondary | ICD-10-CM | POA: Diagnosis not present

## 2016-03-01 ENCOUNTER — Ambulatory Visit: Payer: Commercial Managed Care - HMO

## 2016-03-01 DIAGNOSIS — Z87891 Personal history of nicotine dependence: Secondary | ICD-10-CM | POA: Diagnosis not present

## 2016-03-01 DIAGNOSIS — I10 Essential (primary) hypertension: Secondary | ICD-10-CM | POA: Diagnosis not present

## 2016-03-01 DIAGNOSIS — Z7982 Long term (current) use of aspirin: Secondary | ICD-10-CM | POA: Diagnosis not present

## 2016-03-01 DIAGNOSIS — Z79899 Other long term (current) drug therapy: Secondary | ICD-10-CM | POA: Diagnosis not present

## 2016-03-01 DIAGNOSIS — Z809 Family history of malignant neoplasm, unspecified: Secondary | ICD-10-CM | POA: Diagnosis not present

## 2016-03-01 DIAGNOSIS — C4911 Malignant neoplasm of connective and soft tissue of right upper limb, including shoulder: Secondary | ICD-10-CM | POA: Diagnosis not present

## 2016-03-01 DIAGNOSIS — Z510 Encounter for antineoplastic radiation therapy: Secondary | ICD-10-CM | POA: Diagnosis not present

## 2016-03-02 ENCOUNTER — Ambulatory Visit: Payer: Commercial Managed Care - HMO

## 2016-03-03 ENCOUNTER — Other Ambulatory Visit: Payer: Self-pay | Admitting: *Deleted

## 2016-03-03 ENCOUNTER — Ambulatory Visit: Payer: Commercial Managed Care - HMO

## 2016-03-03 DIAGNOSIS — C4499 Other specified malignant neoplasm of skin, unspecified: Secondary | ICD-10-CM

## 2016-03-06 ENCOUNTER — Ambulatory Visit: Payer: Commercial Managed Care - HMO

## 2016-03-06 DIAGNOSIS — C4911 Malignant neoplasm of connective and soft tissue of right upper limb, including shoulder: Secondary | ICD-10-CM | POA: Diagnosis not present

## 2016-03-06 DIAGNOSIS — I10 Essential (primary) hypertension: Secondary | ICD-10-CM | POA: Diagnosis not present

## 2016-03-06 DIAGNOSIS — Z7982 Long term (current) use of aspirin: Secondary | ICD-10-CM | POA: Diagnosis not present

## 2016-03-06 DIAGNOSIS — C4499 Other specified malignant neoplasm of skin, unspecified: Secondary | ICD-10-CM | POA: Diagnosis not present

## 2016-03-06 DIAGNOSIS — Z87891 Personal history of nicotine dependence: Secondary | ICD-10-CM | POA: Diagnosis not present

## 2016-03-06 DIAGNOSIS — Z809 Family history of malignant neoplasm, unspecified: Secondary | ICD-10-CM | POA: Diagnosis not present

## 2016-03-06 DIAGNOSIS — Z79899 Other long term (current) drug therapy: Secondary | ICD-10-CM | POA: Diagnosis not present

## 2016-03-06 DIAGNOSIS — Z510 Encounter for antineoplastic radiation therapy: Secondary | ICD-10-CM | POA: Diagnosis not present

## 2016-03-07 ENCOUNTER — Ambulatory Visit: Payer: Commercial Managed Care - HMO

## 2016-03-07 DIAGNOSIS — Z809 Family history of malignant neoplasm, unspecified: Secondary | ICD-10-CM | POA: Diagnosis not present

## 2016-03-07 DIAGNOSIS — Z510 Encounter for antineoplastic radiation therapy: Secondary | ICD-10-CM | POA: Diagnosis not present

## 2016-03-07 DIAGNOSIS — Z7982 Long term (current) use of aspirin: Secondary | ICD-10-CM | POA: Diagnosis not present

## 2016-03-07 DIAGNOSIS — Z87891 Personal history of nicotine dependence: Secondary | ICD-10-CM | POA: Diagnosis not present

## 2016-03-07 DIAGNOSIS — I10 Essential (primary) hypertension: Secondary | ICD-10-CM | POA: Diagnosis not present

## 2016-03-07 DIAGNOSIS — C4911 Malignant neoplasm of connective and soft tissue of right upper limb, including shoulder: Secondary | ICD-10-CM | POA: Diagnosis not present

## 2016-03-07 DIAGNOSIS — Z79899 Other long term (current) drug therapy: Secondary | ICD-10-CM | POA: Diagnosis not present

## 2016-03-08 ENCOUNTER — Ambulatory Visit: Payer: Commercial Managed Care - HMO

## 2016-03-08 DIAGNOSIS — I10 Essential (primary) hypertension: Secondary | ICD-10-CM | POA: Diagnosis not present

## 2016-03-08 DIAGNOSIS — Z7982 Long term (current) use of aspirin: Secondary | ICD-10-CM | POA: Diagnosis not present

## 2016-03-08 DIAGNOSIS — C4911 Malignant neoplasm of connective and soft tissue of right upper limb, including shoulder: Secondary | ICD-10-CM | POA: Diagnosis not present

## 2016-03-08 DIAGNOSIS — Z87891 Personal history of nicotine dependence: Secondary | ICD-10-CM | POA: Diagnosis not present

## 2016-03-08 DIAGNOSIS — Z79899 Other long term (current) drug therapy: Secondary | ICD-10-CM | POA: Diagnosis not present

## 2016-03-08 DIAGNOSIS — Z510 Encounter for antineoplastic radiation therapy: Secondary | ICD-10-CM | POA: Diagnosis not present

## 2016-03-08 DIAGNOSIS — Z809 Family history of malignant neoplasm, unspecified: Secondary | ICD-10-CM | POA: Diagnosis not present

## 2016-03-09 ENCOUNTER — Ambulatory Visit: Payer: Commercial Managed Care - HMO

## 2016-03-09 DIAGNOSIS — Z809 Family history of malignant neoplasm, unspecified: Secondary | ICD-10-CM | POA: Diagnosis not present

## 2016-03-09 DIAGNOSIS — Z510 Encounter for antineoplastic radiation therapy: Secondary | ICD-10-CM | POA: Diagnosis not present

## 2016-03-09 DIAGNOSIS — Z79899 Other long term (current) drug therapy: Secondary | ICD-10-CM | POA: Diagnosis not present

## 2016-03-09 DIAGNOSIS — Z87891 Personal history of nicotine dependence: Secondary | ICD-10-CM | POA: Diagnosis not present

## 2016-03-09 DIAGNOSIS — C4911 Malignant neoplasm of connective and soft tissue of right upper limb, including shoulder: Secondary | ICD-10-CM | POA: Diagnosis not present

## 2016-03-09 DIAGNOSIS — I10 Essential (primary) hypertension: Secondary | ICD-10-CM | POA: Diagnosis not present

## 2016-03-09 DIAGNOSIS — Z7982 Long term (current) use of aspirin: Secondary | ICD-10-CM | POA: Diagnosis not present

## 2016-03-10 ENCOUNTER — Ambulatory Visit: Payer: Commercial Managed Care - HMO

## 2016-03-10 DIAGNOSIS — Z7982 Long term (current) use of aspirin: Secondary | ICD-10-CM | POA: Diagnosis not present

## 2016-03-10 DIAGNOSIS — C4911 Malignant neoplasm of connective and soft tissue of right upper limb, including shoulder: Secondary | ICD-10-CM | POA: Diagnosis not present

## 2016-03-10 DIAGNOSIS — Z79899 Other long term (current) drug therapy: Secondary | ICD-10-CM | POA: Diagnosis not present

## 2016-03-10 DIAGNOSIS — Z809 Family history of malignant neoplasm, unspecified: Secondary | ICD-10-CM | POA: Diagnosis not present

## 2016-03-10 DIAGNOSIS — I10 Essential (primary) hypertension: Secondary | ICD-10-CM | POA: Diagnosis not present

## 2016-03-10 DIAGNOSIS — Z87891 Personal history of nicotine dependence: Secondary | ICD-10-CM | POA: Diagnosis not present

## 2016-03-10 DIAGNOSIS — Z510 Encounter for antineoplastic radiation therapy: Secondary | ICD-10-CM | POA: Diagnosis not present

## 2016-03-14 ENCOUNTER — Inpatient Hospital Stay: Payer: Commercial Managed Care - HMO | Attending: Radiation Oncology

## 2016-03-14 ENCOUNTER — Other Ambulatory Visit: Payer: Self-pay

## 2016-03-14 ENCOUNTER — Ambulatory Visit: Payer: Commercial Managed Care - HMO

## 2016-03-14 DIAGNOSIS — Z7982 Long term (current) use of aspirin: Secondary | ICD-10-CM | POA: Diagnosis not present

## 2016-03-14 DIAGNOSIS — Z809 Family history of malignant neoplasm, unspecified: Secondary | ICD-10-CM | POA: Diagnosis not present

## 2016-03-14 DIAGNOSIS — I10 Essential (primary) hypertension: Secondary | ICD-10-CM | POA: Diagnosis not present

## 2016-03-14 DIAGNOSIS — C4499 Other specified malignant neoplasm of skin, unspecified: Secondary | ICD-10-CM

## 2016-03-14 DIAGNOSIS — Z79899 Other long term (current) drug therapy: Secondary | ICD-10-CM | POA: Diagnosis not present

## 2016-03-14 DIAGNOSIS — C4911 Malignant neoplasm of connective and soft tissue of right upper limb, including shoulder: Secondary | ICD-10-CM | POA: Diagnosis not present

## 2016-03-14 DIAGNOSIS — Z510 Encounter for antineoplastic radiation therapy: Secondary | ICD-10-CM | POA: Diagnosis not present

## 2016-03-14 DIAGNOSIS — Z87891 Personal history of nicotine dependence: Secondary | ICD-10-CM | POA: Diagnosis not present

## 2016-03-14 LAB — CBC
HEMATOCRIT: 37.9 % (ref 35.0–47.0)
HEMOGLOBIN: 13.1 g/dL (ref 12.0–16.0)
MCH: 34.6 pg — ABNORMAL HIGH (ref 26.0–34.0)
MCHC: 34.7 g/dL (ref 32.0–36.0)
MCV: 99.8 fL (ref 80.0–100.0)
Platelets: 178 10*3/uL (ref 150–440)
RBC: 3.79 MIL/uL — AB (ref 3.80–5.20)
RDW: 13.3 % (ref 11.5–14.5)
WBC: 4.1 10*3/uL (ref 3.6–11.0)

## 2016-03-15 ENCOUNTER — Ambulatory Visit: Payer: Commercial Managed Care - HMO

## 2016-03-15 DIAGNOSIS — C4911 Malignant neoplasm of connective and soft tissue of right upper limb, including shoulder: Secondary | ICD-10-CM | POA: Diagnosis not present

## 2016-03-15 DIAGNOSIS — I10 Essential (primary) hypertension: Secondary | ICD-10-CM | POA: Diagnosis not present

## 2016-03-15 DIAGNOSIS — Z7982 Long term (current) use of aspirin: Secondary | ICD-10-CM | POA: Diagnosis not present

## 2016-03-15 DIAGNOSIS — Z79899 Other long term (current) drug therapy: Secondary | ICD-10-CM | POA: Diagnosis not present

## 2016-03-15 DIAGNOSIS — Z809 Family history of malignant neoplasm, unspecified: Secondary | ICD-10-CM | POA: Diagnosis not present

## 2016-03-15 DIAGNOSIS — Z510 Encounter for antineoplastic radiation therapy: Secondary | ICD-10-CM | POA: Diagnosis not present

## 2016-03-15 DIAGNOSIS — Z87891 Personal history of nicotine dependence: Secondary | ICD-10-CM | POA: Diagnosis not present

## 2016-03-16 ENCOUNTER — Ambulatory Visit: Payer: Commercial Managed Care - HMO

## 2016-03-16 DIAGNOSIS — Z7982 Long term (current) use of aspirin: Secondary | ICD-10-CM | POA: Diagnosis not present

## 2016-03-16 DIAGNOSIS — C4911 Malignant neoplasm of connective and soft tissue of right upper limb, including shoulder: Secondary | ICD-10-CM | POA: Diagnosis not present

## 2016-03-16 DIAGNOSIS — I10 Essential (primary) hypertension: Secondary | ICD-10-CM | POA: Diagnosis not present

## 2016-03-16 DIAGNOSIS — Z79899 Other long term (current) drug therapy: Secondary | ICD-10-CM | POA: Diagnosis not present

## 2016-03-16 DIAGNOSIS — Z809 Family history of malignant neoplasm, unspecified: Secondary | ICD-10-CM | POA: Diagnosis not present

## 2016-03-16 DIAGNOSIS — Z510 Encounter for antineoplastic radiation therapy: Secondary | ICD-10-CM | POA: Diagnosis not present

## 2016-03-16 DIAGNOSIS — Z87891 Personal history of nicotine dependence: Secondary | ICD-10-CM | POA: Diagnosis not present

## 2016-03-17 ENCOUNTER — Ambulatory Visit: Payer: Commercial Managed Care - HMO

## 2016-03-17 DIAGNOSIS — Z87891 Personal history of nicotine dependence: Secondary | ICD-10-CM | POA: Diagnosis not present

## 2016-03-17 DIAGNOSIS — Z510 Encounter for antineoplastic radiation therapy: Secondary | ICD-10-CM | POA: Diagnosis not present

## 2016-03-17 DIAGNOSIS — C4911 Malignant neoplasm of connective and soft tissue of right upper limb, including shoulder: Secondary | ICD-10-CM | POA: Diagnosis not present

## 2016-03-17 DIAGNOSIS — I10 Essential (primary) hypertension: Secondary | ICD-10-CM | POA: Diagnosis not present

## 2016-03-17 DIAGNOSIS — Z809 Family history of malignant neoplasm, unspecified: Secondary | ICD-10-CM | POA: Diagnosis not present

## 2016-03-17 DIAGNOSIS — Z7982 Long term (current) use of aspirin: Secondary | ICD-10-CM | POA: Diagnosis not present

## 2016-03-17 DIAGNOSIS — Z79899 Other long term (current) drug therapy: Secondary | ICD-10-CM | POA: Diagnosis not present

## 2016-03-20 ENCOUNTER — Ambulatory Visit: Payer: Commercial Managed Care - HMO

## 2016-03-20 DIAGNOSIS — Z7982 Long term (current) use of aspirin: Secondary | ICD-10-CM | POA: Diagnosis not present

## 2016-03-20 DIAGNOSIS — Z79899 Other long term (current) drug therapy: Secondary | ICD-10-CM | POA: Diagnosis not present

## 2016-03-20 DIAGNOSIS — I10 Essential (primary) hypertension: Secondary | ICD-10-CM | POA: Diagnosis not present

## 2016-03-20 DIAGNOSIS — Z87891 Personal history of nicotine dependence: Secondary | ICD-10-CM | POA: Diagnosis not present

## 2016-03-20 DIAGNOSIS — Z809 Family history of malignant neoplasm, unspecified: Secondary | ICD-10-CM | POA: Diagnosis not present

## 2016-03-20 DIAGNOSIS — C4911 Malignant neoplasm of connective and soft tissue of right upper limb, including shoulder: Secondary | ICD-10-CM | POA: Diagnosis not present

## 2016-03-20 DIAGNOSIS — Z510 Encounter for antineoplastic radiation therapy: Secondary | ICD-10-CM | POA: Diagnosis not present

## 2016-03-21 ENCOUNTER — Other Ambulatory Visit: Payer: Self-pay

## 2016-03-21 ENCOUNTER — Ambulatory Visit: Payer: Commercial Managed Care - HMO

## 2016-03-21 ENCOUNTER — Inpatient Hospital Stay: Payer: Commercial Managed Care - HMO

## 2016-03-21 DIAGNOSIS — Z87891 Personal history of nicotine dependence: Secondary | ICD-10-CM | POA: Diagnosis not present

## 2016-03-21 DIAGNOSIS — I10 Essential (primary) hypertension: Secondary | ICD-10-CM | POA: Diagnosis not present

## 2016-03-21 DIAGNOSIS — C4499 Other specified malignant neoplasm of skin, unspecified: Secondary | ICD-10-CM

## 2016-03-21 DIAGNOSIS — Z79899 Other long term (current) drug therapy: Secondary | ICD-10-CM | POA: Diagnosis not present

## 2016-03-21 DIAGNOSIS — Z510 Encounter for antineoplastic radiation therapy: Secondary | ICD-10-CM | POA: Diagnosis not present

## 2016-03-21 DIAGNOSIS — Z7982 Long term (current) use of aspirin: Secondary | ICD-10-CM | POA: Diagnosis not present

## 2016-03-21 DIAGNOSIS — C4911 Malignant neoplasm of connective and soft tissue of right upper limb, including shoulder: Secondary | ICD-10-CM | POA: Diagnosis not present

## 2016-03-21 DIAGNOSIS — Z809 Family history of malignant neoplasm, unspecified: Secondary | ICD-10-CM | POA: Diagnosis not present

## 2016-03-21 LAB — CBC
HEMATOCRIT: 37.1 % (ref 35.0–47.0)
HEMOGLOBIN: 12.9 g/dL (ref 12.0–16.0)
MCH: 34.9 pg — AB (ref 26.0–34.0)
MCHC: 34.8 g/dL (ref 32.0–36.0)
MCV: 100.1 fL — AB (ref 80.0–100.0)
Platelets: 169 10*3/uL (ref 150–440)
RBC: 3.71 MIL/uL — AB (ref 3.80–5.20)
RDW: 12.9 % (ref 11.5–14.5)
WBC: 4.3 10*3/uL (ref 3.6–11.0)

## 2016-03-22 ENCOUNTER — Ambulatory Visit: Payer: Commercial Managed Care - HMO

## 2016-03-22 DIAGNOSIS — Z7982 Long term (current) use of aspirin: Secondary | ICD-10-CM | POA: Diagnosis not present

## 2016-03-22 DIAGNOSIS — I10 Essential (primary) hypertension: Secondary | ICD-10-CM | POA: Diagnosis not present

## 2016-03-22 DIAGNOSIS — C4911 Malignant neoplasm of connective and soft tissue of right upper limb, including shoulder: Secondary | ICD-10-CM | POA: Diagnosis not present

## 2016-03-22 DIAGNOSIS — Z809 Family history of malignant neoplasm, unspecified: Secondary | ICD-10-CM | POA: Diagnosis not present

## 2016-03-22 DIAGNOSIS — Z510 Encounter for antineoplastic radiation therapy: Secondary | ICD-10-CM | POA: Diagnosis not present

## 2016-03-22 DIAGNOSIS — Z79899 Other long term (current) drug therapy: Secondary | ICD-10-CM | POA: Diagnosis not present

## 2016-03-22 DIAGNOSIS — Z87891 Personal history of nicotine dependence: Secondary | ICD-10-CM | POA: Diagnosis not present

## 2016-03-23 ENCOUNTER — Telehealth: Payer: Self-pay | Admitting: *Deleted

## 2016-03-23 ENCOUNTER — Ambulatory Visit: Payer: Commercial Managed Care - HMO

## 2016-03-23 DIAGNOSIS — Z7982 Long term (current) use of aspirin: Secondary | ICD-10-CM | POA: Diagnosis not present

## 2016-03-23 DIAGNOSIS — Z809 Family history of malignant neoplasm, unspecified: Secondary | ICD-10-CM | POA: Diagnosis not present

## 2016-03-23 DIAGNOSIS — Z79899 Other long term (current) drug therapy: Secondary | ICD-10-CM | POA: Diagnosis not present

## 2016-03-23 DIAGNOSIS — Z87891 Personal history of nicotine dependence: Secondary | ICD-10-CM | POA: Diagnosis not present

## 2016-03-23 DIAGNOSIS — Z510 Encounter for antineoplastic radiation therapy: Secondary | ICD-10-CM | POA: Diagnosis not present

## 2016-03-23 DIAGNOSIS — C4911 Malignant neoplasm of connective and soft tissue of right upper limb, including shoulder: Secondary | ICD-10-CM | POA: Diagnosis not present

## 2016-03-23 DIAGNOSIS — I10 Essential (primary) hypertension: Secondary | ICD-10-CM | POA: Diagnosis not present

## 2016-03-23 NOTE — Telephone Encounter (Signed)
-----   Message from Robert Bellow, MD sent at 03/23/2016  7:52 AM EDT ----- Patient seen at [facility] for sarcoma. Sent to RT.  Did she have this completed, how is she doing.

## 2016-03-23 NOTE — Telephone Encounter (Signed)
Appointment at [facility] went well. She is getting radiation at [facility], she feels things are going well.

## 2016-03-24 ENCOUNTER — Ambulatory Visit: Payer: Commercial Managed Care - HMO

## 2016-03-24 DIAGNOSIS — Z809 Family history of malignant neoplasm, unspecified: Secondary | ICD-10-CM | POA: Diagnosis not present

## 2016-03-24 DIAGNOSIS — Z87891 Personal history of nicotine dependence: Secondary | ICD-10-CM | POA: Diagnosis not present

## 2016-03-24 DIAGNOSIS — Z7982 Long term (current) use of aspirin: Secondary | ICD-10-CM | POA: Diagnosis not present

## 2016-03-24 DIAGNOSIS — C4911 Malignant neoplasm of connective and soft tissue of right upper limb, including shoulder: Secondary | ICD-10-CM | POA: Diagnosis not present

## 2016-03-24 DIAGNOSIS — I10 Essential (primary) hypertension: Secondary | ICD-10-CM | POA: Diagnosis not present

## 2016-03-24 DIAGNOSIS — Z79899 Other long term (current) drug therapy: Secondary | ICD-10-CM | POA: Diagnosis not present

## 2016-03-24 DIAGNOSIS — Z510 Encounter for antineoplastic radiation therapy: Secondary | ICD-10-CM | POA: Diagnosis not present

## 2016-03-27 ENCOUNTER — Ambulatory Visit: Payer: Commercial Managed Care - HMO

## 2016-03-27 DIAGNOSIS — Z809 Family history of malignant neoplasm, unspecified: Secondary | ICD-10-CM | POA: Diagnosis not present

## 2016-03-27 DIAGNOSIS — Z79899 Other long term (current) drug therapy: Secondary | ICD-10-CM | POA: Diagnosis not present

## 2016-03-27 DIAGNOSIS — Z87891 Personal history of nicotine dependence: Secondary | ICD-10-CM | POA: Diagnosis not present

## 2016-03-27 DIAGNOSIS — I10 Essential (primary) hypertension: Secondary | ICD-10-CM | POA: Diagnosis not present

## 2016-03-27 DIAGNOSIS — C4911 Malignant neoplasm of connective and soft tissue of right upper limb, including shoulder: Secondary | ICD-10-CM | POA: Diagnosis not present

## 2016-03-27 DIAGNOSIS — Z7982 Long term (current) use of aspirin: Secondary | ICD-10-CM | POA: Diagnosis not present

## 2016-03-27 DIAGNOSIS — Z510 Encounter for antineoplastic radiation therapy: Secondary | ICD-10-CM | POA: Diagnosis not present

## 2016-03-28 ENCOUNTER — Other Ambulatory Visit: Payer: Self-pay

## 2016-03-28 ENCOUNTER — Ambulatory Visit
Admission: RE | Admit: 2016-03-28 | Discharge: 2016-03-28 | Disposition: A | Discharge status: Home / Self Care | Payer: Commercial Managed Care - HMO | Source: Ambulatory Visit | Attending: Radiation Oncology | Admitting: Radiation Oncology

## 2016-03-28 ENCOUNTER — Inpatient Hospital Stay: Payer: Commercial Managed Care - HMO

## 2016-03-28 DIAGNOSIS — C4499 Other specified malignant neoplasm of skin, unspecified: Secondary | ICD-10-CM | POA: Diagnosis not present

## 2016-03-28 DIAGNOSIS — C4911 Malignant neoplasm of connective and soft tissue of right upper limb, including shoulder: Secondary | ICD-10-CM | POA: Diagnosis not present

## 2016-03-28 DIAGNOSIS — Z87891 Personal history of nicotine dependence: Secondary | ICD-10-CM | POA: Diagnosis not present

## 2016-03-28 DIAGNOSIS — Z510 Encounter for antineoplastic radiation therapy: Secondary | ICD-10-CM | POA: Diagnosis not present

## 2016-03-28 DIAGNOSIS — Z79899 Other long term (current) drug therapy: Secondary | ICD-10-CM | POA: Diagnosis not present

## 2016-03-28 DIAGNOSIS — Z809 Family history of malignant neoplasm, unspecified: Secondary | ICD-10-CM | POA: Diagnosis not present

## 2016-03-28 DIAGNOSIS — Z7982 Long term (current) use of aspirin: Secondary | ICD-10-CM | POA: Diagnosis not present

## 2016-03-28 DIAGNOSIS — I10 Essential (primary) hypertension: Secondary | ICD-10-CM | POA: Diagnosis not present

## 2016-03-28 LAB — CBC
HEMATOCRIT: 38.5 % (ref 35.0–47.0)
Hemoglobin: 13.4 g/dL (ref 12.0–16.0)
MCH: 34.9 pg — ABNORMAL HIGH (ref 26.0–34.0)
MCHC: 34.7 g/dL (ref 32.0–36.0)
MCV: 100.6 fL — ABNORMAL HIGH (ref 80.0–100.0)
PLATELETS: 176 10*3/uL (ref 150–440)
RBC: 3.83 MIL/uL (ref 3.80–5.20)
RDW: 13.4 % (ref 11.5–14.5)
WBC: 4.9 10*3/uL (ref 3.6–11.0)

## 2016-03-29 ENCOUNTER — Ambulatory Visit: Payer: Commercial Managed Care - HMO

## 2016-03-29 DIAGNOSIS — Z79899 Other long term (current) drug therapy: Secondary | ICD-10-CM | POA: Diagnosis not present

## 2016-03-29 DIAGNOSIS — Z510 Encounter for antineoplastic radiation therapy: Secondary | ICD-10-CM | POA: Diagnosis not present

## 2016-03-29 DIAGNOSIS — Z809 Family history of malignant neoplasm, unspecified: Secondary | ICD-10-CM | POA: Diagnosis not present

## 2016-03-29 DIAGNOSIS — Z7982 Long term (current) use of aspirin: Secondary | ICD-10-CM | POA: Diagnosis not present

## 2016-03-29 DIAGNOSIS — C4911 Malignant neoplasm of connective and soft tissue of right upper limb, including shoulder: Secondary | ICD-10-CM | POA: Diagnosis not present

## 2016-03-29 DIAGNOSIS — I10 Essential (primary) hypertension: Secondary | ICD-10-CM | POA: Diagnosis not present

## 2016-03-29 DIAGNOSIS — Z87891 Personal history of nicotine dependence: Secondary | ICD-10-CM | POA: Diagnosis not present

## 2016-03-30 ENCOUNTER — Ambulatory Visit: Payer: Commercial Managed Care - HMO

## 2016-03-30 DIAGNOSIS — Z7982 Long term (current) use of aspirin: Secondary | ICD-10-CM | POA: Diagnosis not present

## 2016-03-30 DIAGNOSIS — C4911 Malignant neoplasm of connective and soft tissue of right upper limb, including shoulder: Secondary | ICD-10-CM | POA: Diagnosis not present

## 2016-03-30 DIAGNOSIS — Z79899 Other long term (current) drug therapy: Secondary | ICD-10-CM | POA: Diagnosis not present

## 2016-03-30 DIAGNOSIS — I10 Essential (primary) hypertension: Secondary | ICD-10-CM | POA: Diagnosis not present

## 2016-03-30 DIAGNOSIS — Z510 Encounter for antineoplastic radiation therapy: Secondary | ICD-10-CM | POA: Diagnosis not present

## 2016-03-30 DIAGNOSIS — Z809 Family history of malignant neoplasm, unspecified: Secondary | ICD-10-CM | POA: Diagnosis not present

## 2016-03-30 DIAGNOSIS — Z87891 Personal history of nicotine dependence: Secondary | ICD-10-CM | POA: Diagnosis not present

## 2016-03-31 ENCOUNTER — Ambulatory Visit: Payer: Commercial Managed Care - HMO

## 2016-03-31 DIAGNOSIS — Z87891 Personal history of nicotine dependence: Secondary | ICD-10-CM | POA: Diagnosis not present

## 2016-03-31 DIAGNOSIS — Z7982 Long term (current) use of aspirin: Secondary | ICD-10-CM | POA: Diagnosis not present

## 2016-03-31 DIAGNOSIS — Z510 Encounter for antineoplastic radiation therapy: Secondary | ICD-10-CM | POA: Diagnosis not present

## 2016-03-31 DIAGNOSIS — C4911 Malignant neoplasm of connective and soft tissue of right upper limb, including shoulder: Secondary | ICD-10-CM | POA: Diagnosis not present

## 2016-03-31 DIAGNOSIS — Z809 Family history of malignant neoplasm, unspecified: Secondary | ICD-10-CM | POA: Diagnosis not present

## 2016-03-31 DIAGNOSIS — I10 Essential (primary) hypertension: Secondary | ICD-10-CM | POA: Diagnosis not present

## 2016-03-31 DIAGNOSIS — Z79899 Other long term (current) drug therapy: Secondary | ICD-10-CM | POA: Diagnosis not present

## 2016-04-03 ENCOUNTER — Ambulatory Visit: Payer: Commercial Managed Care - HMO

## 2016-04-03 DIAGNOSIS — I10 Essential (primary) hypertension: Secondary | ICD-10-CM | POA: Diagnosis not present

## 2016-04-03 DIAGNOSIS — C4911 Malignant neoplasm of connective and soft tissue of right upper limb, including shoulder: Secondary | ICD-10-CM | POA: Diagnosis not present

## 2016-04-03 DIAGNOSIS — Z809 Family history of malignant neoplasm, unspecified: Secondary | ICD-10-CM | POA: Diagnosis not present

## 2016-04-03 DIAGNOSIS — Z7982 Long term (current) use of aspirin: Secondary | ICD-10-CM | POA: Diagnosis not present

## 2016-04-03 DIAGNOSIS — Z79899 Other long term (current) drug therapy: Secondary | ICD-10-CM | POA: Diagnosis not present

## 2016-04-03 DIAGNOSIS — Z87891 Personal history of nicotine dependence: Secondary | ICD-10-CM | POA: Diagnosis not present

## 2016-04-03 DIAGNOSIS — Z510 Encounter for antineoplastic radiation therapy: Secondary | ICD-10-CM | POA: Diagnosis not present

## 2016-04-04 ENCOUNTER — Inpatient Hospital Stay: Payer: Commercial Managed Care - HMO

## 2016-04-04 ENCOUNTER — Ambulatory Visit: Payer: Commercial Managed Care - HMO

## 2016-04-04 DIAGNOSIS — Z79899 Other long term (current) drug therapy: Secondary | ICD-10-CM | POA: Diagnosis not present

## 2016-04-04 DIAGNOSIS — C4499 Other specified malignant neoplasm of skin, unspecified: Secondary | ICD-10-CM

## 2016-04-04 DIAGNOSIS — Z809 Family history of malignant neoplasm, unspecified: Secondary | ICD-10-CM | POA: Diagnosis not present

## 2016-04-04 DIAGNOSIS — Z510 Encounter for antineoplastic radiation therapy: Secondary | ICD-10-CM | POA: Diagnosis not present

## 2016-04-04 DIAGNOSIS — I10 Essential (primary) hypertension: Secondary | ICD-10-CM | POA: Diagnosis not present

## 2016-04-04 DIAGNOSIS — C4911 Malignant neoplasm of connective and soft tissue of right upper limb, including shoulder: Secondary | ICD-10-CM | POA: Diagnosis not present

## 2016-04-04 DIAGNOSIS — Z87891 Personal history of nicotine dependence: Secondary | ICD-10-CM | POA: Diagnosis not present

## 2016-04-04 DIAGNOSIS — Z7982 Long term (current) use of aspirin: Secondary | ICD-10-CM | POA: Diagnosis not present

## 2016-04-04 LAB — CBC
HEMATOCRIT: 37.8 % (ref 35.0–47.0)
HEMOGLOBIN: 13.1 g/dL (ref 12.0–16.0)
MCH: 34.7 pg — ABNORMAL HIGH (ref 26.0–34.0)
MCHC: 34.7 g/dL (ref 32.0–36.0)
MCV: 100 fL (ref 80.0–100.0)
Platelets: 166 10*3/uL (ref 150–440)
RBC: 3.78 MIL/uL — AB (ref 3.80–5.20)
RDW: 13.1 % (ref 11.5–14.5)
WBC: 5 10*3/uL (ref 3.6–11.0)

## 2016-04-05 ENCOUNTER — Ambulatory Visit: Payer: Commercial Managed Care - HMO

## 2016-04-05 DIAGNOSIS — Z79899 Other long term (current) drug therapy: Secondary | ICD-10-CM | POA: Diagnosis not present

## 2016-04-05 DIAGNOSIS — Z809 Family history of malignant neoplasm, unspecified: Secondary | ICD-10-CM | POA: Diagnosis not present

## 2016-04-05 DIAGNOSIS — C4911 Malignant neoplasm of connective and soft tissue of right upper limb, including shoulder: Secondary | ICD-10-CM | POA: Diagnosis not present

## 2016-04-05 DIAGNOSIS — Z7982 Long term (current) use of aspirin: Secondary | ICD-10-CM | POA: Diagnosis not present

## 2016-04-05 DIAGNOSIS — Z87891 Personal history of nicotine dependence: Secondary | ICD-10-CM | POA: Diagnosis not present

## 2016-04-05 DIAGNOSIS — Z510 Encounter for antineoplastic radiation therapy: Secondary | ICD-10-CM | POA: Diagnosis not present

## 2016-04-05 DIAGNOSIS — I10 Essential (primary) hypertension: Secondary | ICD-10-CM | POA: Diagnosis not present

## 2016-04-06 ENCOUNTER — Ambulatory Visit: Payer: Commercial Managed Care - HMO

## 2016-04-06 DIAGNOSIS — C4911 Malignant neoplasm of connective and soft tissue of right upper limb, including shoulder: Secondary | ICD-10-CM | POA: Diagnosis not present

## 2016-04-06 DIAGNOSIS — Z809 Family history of malignant neoplasm, unspecified: Secondary | ICD-10-CM | POA: Diagnosis not present

## 2016-04-06 DIAGNOSIS — Z510 Encounter for antineoplastic radiation therapy: Secondary | ICD-10-CM | POA: Diagnosis not present

## 2016-04-06 DIAGNOSIS — Z7982 Long term (current) use of aspirin: Secondary | ICD-10-CM | POA: Diagnosis not present

## 2016-04-06 DIAGNOSIS — Z87891 Personal history of nicotine dependence: Secondary | ICD-10-CM | POA: Diagnosis not present

## 2016-04-06 DIAGNOSIS — I10 Essential (primary) hypertension: Secondary | ICD-10-CM | POA: Diagnosis not present

## 2016-04-06 DIAGNOSIS — Z79899 Other long term (current) drug therapy: Secondary | ICD-10-CM | POA: Diagnosis not present

## 2016-04-07 ENCOUNTER — Ambulatory Visit
Admission: RE | Admit: 2016-04-07 | Discharge: 2016-04-07 | Disposition: A | Discharge status: Home / Self Care | Payer: Commercial Managed Care - HMO | Source: Ambulatory Visit | Attending: Radiation Oncology | Admitting: Radiation Oncology

## 2016-04-07 DIAGNOSIS — Z87891 Personal history of nicotine dependence: Secondary | ICD-10-CM | POA: Diagnosis not present

## 2016-04-07 DIAGNOSIS — C4911 Malignant neoplasm of connective and soft tissue of right upper limb, including shoulder: Secondary | ICD-10-CM | POA: Diagnosis not present

## 2016-04-07 DIAGNOSIS — Z79899 Other long term (current) drug therapy: Secondary | ICD-10-CM | POA: Diagnosis not present

## 2016-04-07 DIAGNOSIS — Z809 Family history of malignant neoplasm, unspecified: Secondary | ICD-10-CM | POA: Diagnosis not present

## 2016-04-07 DIAGNOSIS — Z7982 Long term (current) use of aspirin: Secondary | ICD-10-CM | POA: Diagnosis not present

## 2016-04-07 DIAGNOSIS — Z510 Encounter for antineoplastic radiation therapy: Secondary | ICD-10-CM | POA: Diagnosis not present

## 2016-04-07 DIAGNOSIS — I10 Essential (primary) hypertension: Secondary | ICD-10-CM | POA: Diagnosis not present

## 2016-04-10 ENCOUNTER — Ambulatory Visit
Admission: RE | Admit: 2016-04-10 | Discharge: 2016-04-10 | Disposition: A | Discharge status: Home / Self Care | Payer: Commercial Managed Care - HMO | Source: Ambulatory Visit | Attending: Radiation Oncology | Admitting: Radiation Oncology

## 2016-04-10 DIAGNOSIS — Z87891 Personal history of nicotine dependence: Secondary | ICD-10-CM | POA: Diagnosis not present

## 2016-04-10 DIAGNOSIS — Z79899 Other long term (current) drug therapy: Secondary | ICD-10-CM | POA: Diagnosis not present

## 2016-04-10 DIAGNOSIS — Z7982 Long term (current) use of aspirin: Secondary | ICD-10-CM | POA: Diagnosis not present

## 2016-04-10 DIAGNOSIS — C4911 Malignant neoplasm of connective and soft tissue of right upper limb, including shoulder: Secondary | ICD-10-CM | POA: Diagnosis not present

## 2016-04-10 DIAGNOSIS — I10 Essential (primary) hypertension: Secondary | ICD-10-CM | POA: Diagnosis not present

## 2016-04-10 DIAGNOSIS — Z510 Encounter for antineoplastic radiation therapy: Secondary | ICD-10-CM | POA: Diagnosis not present

## 2016-04-10 DIAGNOSIS — Z809 Family history of malignant neoplasm, unspecified: Secondary | ICD-10-CM | POA: Diagnosis not present

## 2016-04-11 ENCOUNTER — Ambulatory Visit
Admission: RE | Admit: 2016-04-11 | Discharge: 2016-04-11 | Disposition: A | Discharge status: Home / Self Care | Payer: Commercial Managed Care - HMO | Source: Ambulatory Visit | Attending: Radiation Oncology | Admitting: Radiation Oncology

## 2016-04-11 DIAGNOSIS — Z79899 Other long term (current) drug therapy: Secondary | ICD-10-CM | POA: Diagnosis not present

## 2016-04-11 DIAGNOSIS — C4911 Malignant neoplasm of connective and soft tissue of right upper limb, including shoulder: Secondary | ICD-10-CM | POA: Diagnosis not present

## 2016-04-11 DIAGNOSIS — Z809 Family history of malignant neoplasm, unspecified: Secondary | ICD-10-CM | POA: Diagnosis not present

## 2016-04-11 DIAGNOSIS — Z7982 Long term (current) use of aspirin: Secondary | ICD-10-CM | POA: Diagnosis not present

## 2016-04-11 DIAGNOSIS — I10 Essential (primary) hypertension: Secondary | ICD-10-CM | POA: Diagnosis not present

## 2016-04-11 DIAGNOSIS — C4499 Other specified malignant neoplasm of skin, unspecified: Secondary | ICD-10-CM | POA: Diagnosis not present

## 2016-04-11 DIAGNOSIS — Z510 Encounter for antineoplastic radiation therapy: Secondary | ICD-10-CM | POA: Diagnosis not present

## 2016-04-11 DIAGNOSIS — Z87891 Personal history of nicotine dependence: Secondary | ICD-10-CM | POA: Diagnosis not present

## 2016-04-12 ENCOUNTER — Ambulatory Visit: Payer: Commercial Managed Care - HMO

## 2016-04-12 DIAGNOSIS — C499 Malignant neoplasm of connective and soft tissue, unspecified: Secondary | ICD-10-CM | POA: Diagnosis not present

## 2016-04-27 DIAGNOSIS — J948 Other specified pleural conditions: Secondary | ICD-10-CM | POA: Diagnosis not present

## 2016-04-27 DIAGNOSIS — J929 Pleural plaque without asbestos: Secondary | ICD-10-CM | POA: Diagnosis not present

## 2016-04-27 DIAGNOSIS — M25421 Effusion, right elbow: Secondary | ICD-10-CM | POA: Diagnosis not present

## 2016-04-27 DIAGNOSIS — C499 Malignant neoplasm of connective and soft tissue, unspecified: Secondary | ICD-10-CM | POA: Diagnosis not present

## 2016-04-27 DIAGNOSIS — R918 Other nonspecific abnormal finding of lung field: Secondary | ICD-10-CM | POA: Diagnosis not present

## 2016-05-03 DIAGNOSIS — C499 Malignant neoplasm of connective and soft tissue, unspecified: Secondary | ICD-10-CM | POA: Diagnosis not present

## 2016-05-05 DIAGNOSIS — C499 Malignant neoplasm of connective and soft tissue, unspecified: Secondary | ICD-10-CM | POA: Insufficient documentation

## 2016-05-18 ENCOUNTER — Ambulatory Visit: Payer: Commercial Managed Care - HMO | Admitting: Radiation Oncology

## 2016-05-21 ENCOUNTER — Emergency Department
Admission: EM | Admit: 2016-05-21 | Discharge: 2016-05-21 | Disposition: A | Discharge status: Home / Self Care | Payer: Commercial Managed Care - HMO | Attending: Emergency Medicine | Admitting: Emergency Medicine

## 2016-05-21 ENCOUNTER — Emergency Department: Payer: Commercial Managed Care - HMO

## 2016-05-21 DIAGNOSIS — Z7982 Long term (current) use of aspirin: Secondary | ICD-10-CM | POA: Diagnosis not present

## 2016-05-21 DIAGNOSIS — Y939 Activity, unspecified: Secondary | ICD-10-CM | POA: Diagnosis not present

## 2016-05-21 DIAGNOSIS — S299XXA Unspecified injury of thorax, initial encounter: Secondary | ICD-10-CM | POA: Diagnosis not present

## 2016-05-21 DIAGNOSIS — S0990XA Unspecified injury of head, initial encounter: Secondary | ICD-10-CM | POA: Diagnosis not present

## 2016-05-21 DIAGNOSIS — W19XXXA Unspecified fall, initial encounter: Secondary | ICD-10-CM | POA: Diagnosis not present

## 2016-05-21 DIAGNOSIS — R51 Headache: Secondary | ICD-10-CM | POA: Diagnosis not present

## 2016-05-21 DIAGNOSIS — W08XXXA Fall from other furniture, initial encounter: Secondary | ICD-10-CM | POA: Diagnosis not present

## 2016-05-21 DIAGNOSIS — F17210 Nicotine dependence, cigarettes, uncomplicated: Secondary | ICD-10-CM | POA: Insufficient documentation

## 2016-05-21 DIAGNOSIS — I10 Essential (primary) hypertension: Secondary | ICD-10-CM | POA: Insufficient documentation

## 2016-05-21 DIAGNOSIS — Y999 Unspecified external cause status: Secondary | ICD-10-CM | POA: Insufficient documentation

## 2016-05-21 DIAGNOSIS — Y929 Unspecified place or not applicable: Secondary | ICD-10-CM | POA: Insufficient documentation

## 2016-05-21 DIAGNOSIS — T148XXA Other injury of unspecified body region, initial encounter: Secondary | ICD-10-CM

## 2016-05-21 DIAGNOSIS — S199XXA Unspecified injury of neck, initial encounter: Secondary | ICD-10-CM | POA: Diagnosis not present

## 2016-05-21 DIAGNOSIS — S40012A Contusion of left shoulder, initial encounter: Secondary | ICD-10-CM | POA: Diagnosis not present

## 2016-05-21 DIAGNOSIS — M542 Cervicalgia: Secondary | ICD-10-CM | POA: Diagnosis not present

## 2016-05-21 DIAGNOSIS — S42032A Displaced fracture of lateral end of left clavicle, initial encounter for closed fracture: Secondary | ICD-10-CM | POA: Diagnosis not present

## 2016-05-21 DIAGNOSIS — Z79899 Other long term (current) drug therapy: Secondary | ICD-10-CM | POA: Diagnosis not present

## 2016-05-21 DIAGNOSIS — R079 Chest pain, unspecified: Secondary | ICD-10-CM | POA: Diagnosis not present

## 2016-05-21 DIAGNOSIS — S4992XA Unspecified injury of left shoulder and upper arm, initial encounter: Secondary | ICD-10-CM | POA: Diagnosis present

## 2016-05-21 DIAGNOSIS — M25519 Pain in unspecified shoulder: Secondary | ICD-10-CM | POA: Diagnosis not present

## 2016-05-21 LAB — CBC WITH DIFFERENTIAL/PLATELET
BASOS ABS: 0 10*3/uL (ref 0–0.1)
BASOS PCT: 0 %
EOS ABS: 0 10*3/uL (ref 0–0.7)
Eosinophils Relative: 0 %
HCT: 43.4 % (ref 35.0–47.0)
HEMOGLOBIN: 15.3 g/dL (ref 12.0–16.0)
LYMPHS ABS: 0.8 10*3/uL — AB (ref 1.0–3.6)
Lymphocytes Relative: 12 %
MCH: 34.6 pg — ABNORMAL HIGH (ref 26.0–34.0)
MCHC: 35.2 g/dL (ref 32.0–36.0)
MCV: 98.2 fL (ref 80.0–100.0)
Monocytes Absolute: 0.5 10*3/uL (ref 0.2–0.9)
Monocytes Relative: 8 %
NEUTROS PCT: 80 %
Neutro Abs: 5.2 10*3/uL (ref 1.4–6.5)
Platelets: 174 10*3/uL (ref 150–440)
RBC: 4.42 MIL/uL (ref 3.80–5.20)
RDW: 14.1 % (ref 11.5–14.5)
WBC: 6.6 10*3/uL (ref 3.6–11.0)

## 2016-05-21 LAB — PROTIME-INR
INR: 0.88
PROTHROMBIN TIME: 11.9 s (ref 11.4–15.2)

## 2016-05-21 NOTE — ED Triage Notes (Signed)
Pt to ED from home via [facility] c/o fall. Per EMS pt fell Friday off a bar stool, injuring left shoulder, bruising and deformity noted. Pt alert and oriented in no acute distress at this time.

## 2016-05-21 NOTE — ED Provider Notes (Addendum)
[facility] Emergency Department Provider Note  ____________________________________________   I have reviewed the triage vital signs and the nursing notes.   HISTORY  Chief Complaint Fall    HPI Patricia Casey is a 65 y.o. female who states that she fell off a barstool 2 days ago and she injured her shoulder. She did not pass out. She thinks she might have slightly hit her head. She denies any fever or chills. She denies any numbness or weakness. She can move her shoulder somewhat. She does take aspirin. This is a non-syncopal fall. She does drink on a daily basis. Last week was last night. No history of alcohol withdrawal she states. Patient is not currently getting chemotherapy radiation. She does states she has a history of cancer.      Past Medical History:  Diagnosis Date  . Allergy   . Hypertension     Patient Active Problem List   Diagnosis Date Noted  . 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 Other bursal cyst of elbow 08/17/2015  . Dizziness 07/27/2015  . Dermatitis, eczematoid 07/27/2015  . Abnormal liver enzymes 07/27/2015  . Fatigue 07/27/2015  . Esophagitis, reflux 07/27/2015  . H/O alcohol abuse 07/27/2015  . Hearing loss of left ear 07/27/2015  . Hypotension, postural 07/27/2015  . Fast heart beat 07/27/2015  . Compulsive tobacco user syndrome 07/27/2015  . Allergic rhinitis 03/16/2009  . Hypercholesteremia 03/30/2004  . BP (high blood pressure) 08/10/1993    Past Surgical History:  Procedure Laterality Date  . HERNIATED NUCLEUS PULPOSUS  1997   Back surgery     Prior to Admission medications   Medication Sig Start Date End Date Taking? Authorizing Provider  aspirin 81 MG tablet Take 1 tablet by mouth daily. 03/11/08   Historical Provider, MD  Cyanocobalamin (RA VITAMIN B-12 TR) 1000 MCG TBCR Take by mouth.    Historical Provider, MD  fluticasone (FLONASE) 50  MCG/ACT nasal spray Place 2 sprays into the nose daily.  10/26/14   Historical Provider, MD  HYDROcodone-acetaminophen (NORCO) 5-325 MG tablet Take 1-2 tablets by mouth every 4 (four) hours as needed for moderate pain. Patient not taking: Reported on 02/18/2016 12/07/15   Robert Bellow, MD  loratadine (CLARITIN) 10 MG tablet Take 1 tablet by mouth daily as needed.  12/12/13   Historical Provider, MD  metoprolol succinate (TOPROL-XL) 25 MG 24 hr tablet TAKE 1 TABLET EVERY DAY 02/15/16   Mar Daring, PA-C  montelukast (SINGULAIR) 10 MG tablet TAKE 1 TABLET EVERY DAY 09/21/15   Margarita Rana, MD    Allergies Patient has no known allergies.  Family History  Problem Relation Age of Onset  . Cancer Mother     Pancreatitic  . CAD Father     Social History Social History  Substance Use Topics  . Smoking status: Current Every Day Smoker    Packs/day: 1.50    Years: 50.00    Types: Cigarettes  . Smokeless tobacco: Never Used  . Alcohol use 12.6 oz/week    21 Cans of beer per week    Review of Systems Constitutional: No fever/chills Eyes: No visual changes. ENT: No sore throat. No stiff neck no neck pain Cardiovascular: Denies chest pain. Respiratory: Denies shortness of breath. Gastrointestinal:   no vomiting.  No diarrhea.  No constipation. Genitourinary: Negative for dysuria. Musculoskeletal: Negative lower extremity swelling Skin: Negative for rash. Neurological: Negative for severe headaches, focal weakness or  numbness. 10-point ROS otherwise negative.  ____________________________________________   PHYSICAL EXAM:  VITAL SIGNS: ED Triage Vitals  Enc Vitals Group     BP 05/21/16 1117 (!) 155/96     Pulse Rate 05/21/16 1117 (!) 101     Resp 05/21/16 1117 16     Temp 05/21/16 1117 98.1 F (36.7 C)     Temp Source 05/21/16 1117 Oral     SpO2 05/21/16 1117 97 %     Weight 05/21/16 1116 144 lb (65.3 kg)     Height 05/21/16 1116 5\' 8"  (1.727 m)     Head  Circumference --      [facility] --      Pain Score 05/21/16 1117 8     Pain Loc --      Pain Edu? --      Excl. in [place]? --     Constitutional: Alert and oriented. Well appearing and in no acute distress. Eyes: Conjunctivae are normal. PERRL. EOMI. Head: Atraumatic. Nose: No congestion/rhinnorhea. Mouth/Throat: Mucous membranes are moist.  Oropharynx non-erythematous. Neck: No stridor.   She does have bruising to the left trapezius muscle and she does have some tenderness towards the midline but does not cross the midline Cardiovascular: Normal rate, regular rhythm. Grossly normal heart sounds.  Good peripheral circulation. Respiratory: Normal respiratory effort.  No retractions. Lungs CTAB. Abdominal: Soft and nontender. No distention. No guarding no rebound Back:  There is no focal tenderness or step off.  there is no midline tenderness there are no lesions noted. there is no CVA tenderness Musculoskeletal: No lower extremity tenderness, she has some obvious deformity to the distal clavicle. There is no significant pain to the shoulder itself. She has full range of motion of the shoulder with discomfort mostly to the clavicular region. Strong pulses. No joint effusions, no DVT signs strong distal pulses no edema, there is no tenting of the skin the skin is not under tension from the fracture Neurologic:  Normal speech and language. No gross focal neurologic deficits are appreciated.  Skin:  Skin is warm, dry and intact. Extensive bruising noted to the trapezius muscle tracking towards the back and down the anterior chest wall somewhat. Bruising seems to be old. Psychiatric: Mood and affect are normal. Speech and behavior are normal.  ____________________________________________   LABS (all labs ordered are listed, but only abnormal results are displayed)  Labs Reviewed  CBC WITH DIFFERENTIAL/PLATELET - Abnormal; Notable for the following:       Result Value   MCH 34.6 (*)    Lymphs  Abs 0.8 (*)    All other components within normal limits  BASIC METABOLIC PANEL  PROTIME-INR   ____________________________________________  EKG  I personally interpreted any EKGs ordered by me or triage  ____________________________________________  RADIOLOGY  I reviewed any imaging ordered by me or triage that were performed during my shift and, if possible, patient and/or family made aware of any abnormal findings. ____________________________________________   PROCEDURES  Procedure(s) performed: None  Procedures  Critical Care performed: None  ____________________________________________   INITIAL IMPRESSION / ASSESSMENT AND PLAN / ED COURSE  Pertinent labs & imaging results that were available during my care of the patient were reviewed by me and considered in my medical decision making (see chart for details).  Patient with a non-syncopal fall, x-ray shows a clavicular fracture, because of her aspirin we did do CT head which is negative and CT cervical spine because of her discomfort which is negative, there  is no evidence of pneumothorax or shoulder injury aside from the distal clavicle. I did discuss with Dr. Roland Rack, of orthopedic surgery, who feels the patient to be placed in a cuff and collar. I did offer the patient stronger pain medications and Tylenol but she feels that Tylenol should be sufficient. No other injury noted. We did check coags because of the significant bruising that is noted, there thus far reassuring. We will send the patient home. No evidence of compartment syndrome, patient denies assault or unsafe living arrangements, pulses are strong, no evidence of skin or compromise.  Clinical Course    ____________________________________________   FINAL CLINICAL IMPRESSION(S) / ED DIAGNOSES  Final diagnoses:  None      This chart was dictated using voice recognition software.  Despite best efforts to proofread,  errors can occur which can change  meaning.      Schuyler Amor, MD 05/21/16 [place], MD 05/21/16 [phone]

## 2016-05-21 NOTE — ED Notes (Signed)
Clavicle collar applied.

## 2016-05-21 NOTE — ED Notes (Signed)
Patient transported to X-ray 

## 2016-05-29 DIAGNOSIS — C499 Malignant neoplasm of connective and soft tissue, unspecified: Secondary | ICD-10-CM | POA: Diagnosis not present

## 2016-05-29 DIAGNOSIS — I10 Essential (primary) hypertension: Secondary | ICD-10-CM | POA: Diagnosis not present

## 2016-05-29 DIAGNOSIS — C4911 Malignant neoplasm of connective and soft tissue of right upper limb, including shoulder: Secondary | ICD-10-CM | POA: Diagnosis not present

## 2016-05-29 DIAGNOSIS — M6281 Muscle weakness (generalized): Secondary | ICD-10-CM | POA: Diagnosis not present

## 2016-05-29 DIAGNOSIS — F17210 Nicotine dependence, cigarettes, uncomplicated: Secondary | ICD-10-CM | POA: Diagnosis not present

## 2016-05-29 DIAGNOSIS — S42032A Displaced fracture of lateral end of left clavicle, initial encounter for closed fracture: Secondary | ICD-10-CM | POA: Diagnosis not present

## 2016-05-29 DIAGNOSIS — Z5189 Encounter for other specified aftercare: Secondary | ICD-10-CM | POA: Diagnosis not present

## 2016-05-29 DIAGNOSIS — G8929 Other chronic pain: Secondary | ICD-10-CM | POA: Diagnosis not present

## 2016-05-29 DIAGNOSIS — C4499 Other specified malignant neoplasm of skin, unspecified: Secondary | ICD-10-CM | POA: Diagnosis not present

## 2016-06-08 DIAGNOSIS — M2550 Pain in unspecified joint: Secondary | ICD-10-CM | POA: Diagnosis not present

## 2016-06-08 DIAGNOSIS — I10 Essential (primary) hypertension: Secondary | ICD-10-CM | POA: Diagnosis not present

## 2016-06-08 DIAGNOSIS — C499 Malignant neoplasm of connective and soft tissue, unspecified: Secondary | ICD-10-CM | POA: Diagnosis not present

## 2016-06-08 DIAGNOSIS — J309 Allergic rhinitis, unspecified: Secondary | ICD-10-CM | POA: Diagnosis not present

## 2016-06-08 DIAGNOSIS — M6281 Muscle weakness (generalized): Secondary | ICD-10-CM | POA: Diagnosis not present

## 2016-06-08 DIAGNOSIS — G8929 Other chronic pain: Secondary | ICD-10-CM | POA: Diagnosis not present

## 2016-06-08 DIAGNOSIS — Z5189 Encounter for other specified aftercare: Secondary | ICD-10-CM | POA: Diagnosis not present

## 2016-06-13 DIAGNOSIS — I10 Essential (primary) hypertension: Secondary | ICD-10-CM | POA: Diagnosis not present

## 2016-06-13 DIAGNOSIS — C499 Malignant neoplasm of connective and soft tissue, unspecified: Secondary | ICD-10-CM | POA: Diagnosis not present

## 2016-06-20 DIAGNOSIS — I10 Essential (primary) hypertension: Secondary | ICD-10-CM | POA: Diagnosis not present

## 2016-06-20 DIAGNOSIS — J309 Allergic rhinitis, unspecified: Secondary | ICD-10-CM | POA: Diagnosis not present

## 2016-06-20 DIAGNOSIS — M2550 Pain in unspecified joint: Secondary | ICD-10-CM | POA: Diagnosis not present

## 2016-06-21 ENCOUNTER — Ambulatory Visit: Payer: Commercial Managed Care - HMO | Admitting: Radiation Oncology

## 2016-06-29 DIAGNOSIS — Z483 Aftercare following surgery for neoplasm: Secondary | ICD-10-CM | POA: Diagnosis not present

## 2016-06-29 DIAGNOSIS — C4911 Malignant neoplasm of connective and soft tissue of right upper limb, including shoulder: Secondary | ICD-10-CM | POA: Diagnosis not present

## 2016-06-29 DIAGNOSIS — Z5189 Encounter for other specified aftercare: Secondary | ICD-10-CM | POA: Diagnosis not present

## 2016-06-29 DIAGNOSIS — Z7982 Long term (current) use of aspirin: Secondary | ICD-10-CM | POA: Diagnosis not present

## 2016-06-29 DIAGNOSIS — C499 Malignant neoplasm of connective and soft tissue, unspecified: Secondary | ICD-10-CM | POA: Diagnosis not present

## 2016-06-29 DIAGNOSIS — M84512D Pathological fracture in neoplastic disease, left shoulder, subsequent encounter for fracture with routine healing: Secondary | ICD-10-CM | POA: Diagnosis not present

## 2016-06-29 DIAGNOSIS — M6281 Muscle weakness (generalized): Secondary | ICD-10-CM | POA: Diagnosis not present

## 2016-06-30 ENCOUNTER — Encounter: Payer: Self-pay | Admitting: Family Medicine

## 2016-06-30 ENCOUNTER — Ambulatory Visit (INDEPENDENT_AMBULATORY_CARE_PROVIDER_SITE_OTHER): Payer: Commercial Managed Care - HMO | Admitting: Family Medicine

## 2016-06-30 VITALS — BP 146/86 | HR 92 | Temp 97.5°F | Resp 16

## 2016-06-30 DIAGNOSIS — C4499 Other specified malignant neoplasm of skin, unspecified: Secondary | ICD-10-CM | POA: Diagnosis not present

## 2016-06-30 DIAGNOSIS — C4911 Malignant neoplasm of connective and soft tissue of right upper limb, including shoulder: Secondary | ICD-10-CM | POA: Diagnosis not present

## 2016-06-30 DIAGNOSIS — M84512D Pathological fracture in neoplastic disease, left shoulder, subsequent encounter for fracture with routine healing: Secondary | ICD-10-CM | POA: Diagnosis not present

## 2016-06-30 DIAGNOSIS — Z483 Aftercare following surgery for neoplasm: Secondary | ICD-10-CM | POA: Diagnosis not present

## 2016-06-30 DIAGNOSIS — L989 Disorder of the skin and subcutaneous tissue, unspecified: Secondary | ICD-10-CM

## 2016-06-30 DIAGNOSIS — R29898 Other symptoms and signs involving the musculoskeletal system: Secondary | ICD-10-CM | POA: Diagnosis not present

## 2016-06-30 DIAGNOSIS — H6122 Impacted cerumen, left ear: Secondary | ICD-10-CM

## 2016-06-30 DIAGNOSIS — Z7982 Long term (current) use of aspirin: Secondary | ICD-10-CM | POA: Diagnosis not present

## 2016-06-30 DIAGNOSIS — I10 Essential (primary) hypertension: Secondary | ICD-10-CM | POA: Diagnosis not present

## 2016-06-30 NOTE — Progress Notes (Signed)
Patient: Patricia Casey Female    DOB: 02-26-1951   65 y.o.   MRN: [id number] Visit Date: 06/30/2016  Today's Provider: Lelon Huh, MD   Chief Complaint  Patient presents with  . Transitions Of Care  . Follow-up   Subjective:    HPI  This is a previous patient of Dr. Venia Minks present today as new patient to me to establish care and follow up on chronic medical problems.   Transition of Care:  Patient underwent surgery on 05/29/2016 radical resection right right elbow sarcoma 11.30.2017 at [facility]. She was discharged to [facility] for rehabilitation. Was discharged to home on 06-28-2016 Patient was recently released from [facility] and continue follow up with [facility] orthopedics. Received radiation treatments which made her very fatigued and had difficulty ambulating. Still requiring walker to ambulate and scheduled to start home PT.   She also complains of left ear feeling full and like there is water in associated with difficulty hearing.   She has also notice a skin lesion on left posterior leg for the last couple of months. It does not itch and is not painful. She doesn't think it has changed much in size since she notice it.     No Known Allergies   Current Outpatient Prescriptions:  .  aspirin 81 MG tablet, Take 1 tablet by mouth daily., Disp: , Rfl:  .  Cyanocobalamin (RA VITAMIN B-12 TR) 1000 MCG TBCR, Take by mouth., Disp: , Rfl:  .  fluticasone (FLONASE) 50 MCG/ACT nasal spray, Place 2 sprays into the nose daily. , Disp: , Rfl:  .  loratadine (CLARITIN) 10 MG tablet, Take 1 tablet by mouth daily as needed. , Disp: , Rfl:  .  metoprolol succinate (TOPROL-XL) 25 MG 24 hr tablet, TAKE 1 TABLET EVERY DAY, Disp: 90 tablet, Rfl: 1 .  montelukast (SINGULAIR) 10 MG tablet, TAKE 1 TABLET EVERY DAY, Disp: 90 tablet, Rfl: 3  Review of Systems  Constitutional: Negative for appetite change, chills, fatigue and fever.  HENT: Positive for ear discharge.        Left  ear congestion x 1 month  Respiratory: Negative for chest tightness and shortness of breath.   Cardiovascular: Negative for chest pain and palpitations.  Gastrointestinal: Negative for abdominal pain, nausea and vomiting.  Musculoskeletal: Positive for myalgias (right arm pain).  Skin: Positive for wound.       Left calf and right elbow  Neurological: Negative for dizziness and weakness.    Social History  Substance Use Topics  . Smoking status: Current Every Day Smoker    Packs/day: 1.50    Years: 50.00    Types: Cigarettes  . Smokeless tobacco: Never Used  . Alcohol use 12.6 oz/week    21 Cans of beer per week   Objective:   BP (!) 146/86 (BP Location: Left Arm, Patient Position: Sitting, Cuff Size: Normal)   Pulse 92   Temp 97.5 F (36.4 C) (Oral)   Resp 16   SpO2 97% Comment: room air  Physical Exam   General Appearance:    Alert, cooperative, no distress  ENT: Excessive cerumen right, obstructed on left.   Eyes:    PERRL, conjunctiva/corneas clear, EOM's intact       Lungs:     Clear to auscultation bilaterally, respirations unlabored  Heart:  Frequent premature beats.   Skin:  about 1cm elongated pigmented slightly raised lesion left posterior lower leg.   Neurologic:   Awake, alert,  oriented x 3. No apparent focal neurological           defect.   MS:   +4 and symmetric both LEs.        Assessment & Plan:     1. Leg skin lesion, left  - Ambulatory referral to dermatology  2. Hearing loss of left ear due to cerumen impaction After soaking with Debrox, ear canal was irrigated with water until clear. Patient tolerated procedure well. There was noted to be large amount of white discharge from ear canal and some bleeding after irrigation. Ear drum is intact and she has no pain. She is to return in a few weeks to recheck ear canal.    3. Essential hypertension Stable. Continue current medications.  Check routine labs at follow up.   4. Myxofibrosarcoma of  skin Continue follow up with surgery and radiation oncology.   5. Weakness of both lower extremities Apparently secondary to radiation treatments. Is slowly improving. Continue physical therapy. Continue ambulating only with walker until cleared by PT.   She refused flu vaccine and pneumonia vaccine today.   Addressed extensive list of chronic and acute medical problems today requiring extensive time in counseling and coordination care.  Over half of this 45 minute visit were spent in counseling and coordinating care of multiple medical problems.       Lelon Huh, MD  [facility]

## 2016-07-05 DIAGNOSIS — C4911 Malignant neoplasm of connective and soft tissue of right upper limb, including shoulder: Secondary | ICD-10-CM | POA: Diagnosis not present

## 2016-07-05 DIAGNOSIS — Z7982 Long term (current) use of aspirin: Secondary | ICD-10-CM | POA: Diagnosis not present

## 2016-07-05 DIAGNOSIS — M84512D Pathological fracture in neoplastic disease, left shoulder, subsequent encounter for fracture with routine healing: Secondary | ICD-10-CM | POA: Diagnosis not present

## 2016-07-05 DIAGNOSIS — Z483 Aftercare following surgery for neoplasm: Secondary | ICD-10-CM | POA: Diagnosis not present

## 2016-07-06 DIAGNOSIS — Z483 Aftercare following surgery for neoplasm: Secondary | ICD-10-CM | POA: Diagnosis not present

## 2016-07-06 DIAGNOSIS — M84512D Pathological fracture in neoplastic disease, left shoulder, subsequent encounter for fracture with routine healing: Secondary | ICD-10-CM | POA: Diagnosis not present

## 2016-07-06 DIAGNOSIS — Z7982 Long term (current) use of aspirin: Secondary | ICD-10-CM | POA: Diagnosis not present

## 2016-07-06 DIAGNOSIS — C4911 Malignant neoplasm of connective and soft tissue of right upper limb, including shoulder: Secondary | ICD-10-CM | POA: Diagnosis not present

## 2016-07-07 DIAGNOSIS — M84512D Pathological fracture in neoplastic disease, left shoulder, subsequent encounter for fracture with routine healing: Secondary | ICD-10-CM | POA: Diagnosis not present

## 2016-07-07 DIAGNOSIS — C4911 Malignant neoplasm of connective and soft tissue of right upper limb, including shoulder: Secondary | ICD-10-CM | POA: Diagnosis not present

## 2016-07-07 DIAGNOSIS — Z7982 Long term (current) use of aspirin: Secondary | ICD-10-CM | POA: Diagnosis not present

## 2016-07-07 DIAGNOSIS — Z483 Aftercare following surgery for neoplasm: Secondary | ICD-10-CM | POA: Diagnosis not present

## 2016-07-12 DIAGNOSIS — Z5189 Encounter for other specified aftercare: Secondary | ICD-10-CM | POA: Diagnosis not present

## 2016-07-12 DIAGNOSIS — Z7982 Long term (current) use of aspirin: Secondary | ICD-10-CM | POA: Diagnosis not present

## 2016-07-12 DIAGNOSIS — Z6821 Body mass index (BMI) 21.0-21.9, adult: Secondary | ICD-10-CM | POA: Diagnosis not present

## 2016-07-12 DIAGNOSIS — S51001A Unspecified open wound of right elbow, initial encounter: Secondary | ICD-10-CM | POA: Diagnosis not present

## 2016-07-12 DIAGNOSIS — Z483 Aftercare following surgery for neoplasm: Secondary | ICD-10-CM | POA: Diagnosis not present

## 2016-07-12 DIAGNOSIS — E43 Unspecified severe protein-calorie malnutrition: Secondary | ICD-10-CM | POA: Diagnosis not present

## 2016-07-12 DIAGNOSIS — C4911 Malignant neoplasm of connective and soft tissue of right upper limb, including shoulder: Secondary | ICD-10-CM | POA: Diagnosis not present

## 2016-07-12 DIAGNOSIS — T8132XA Disruption of internal operation (surgical) wound, not elsewhere classified, initial encounter: Secondary | ICD-10-CM | POA: Diagnosis not present

## 2016-07-12 DIAGNOSIS — L988 Other specified disorders of the skin and subcutaneous tissue: Secondary | ICD-10-CM | POA: Diagnosis not present

## 2016-07-12 DIAGNOSIS — F17210 Nicotine dependence, cigarettes, uncomplicated: Secondary | ICD-10-CM | POA: Diagnosis not present

## 2016-07-12 DIAGNOSIS — Z8589 Personal history of malignant neoplasm of other organs and systems: Secondary | ICD-10-CM | POA: Diagnosis not present

## 2016-07-12 DIAGNOSIS — M84512D Pathological fracture in neoplastic disease, left shoulder, subsequent encounter for fracture with routine healing: Secondary | ICD-10-CM | POA: Diagnosis not present

## 2016-07-12 DIAGNOSIS — Z716 Tobacco abuse counseling: Secondary | ICD-10-CM | POA: Diagnosis not present

## 2016-07-12 DIAGNOSIS — S51009A Unspecified open wound of unspecified elbow, initial encounter: Secondary | ICD-10-CM | POA: Diagnosis not present

## 2016-07-21 ENCOUNTER — Ambulatory Visit: Payer: Self-pay | Admitting: Family Medicine

## 2016-07-24 DIAGNOSIS — Z5189 Encounter for other specified aftercare: Secondary | ICD-10-CM | POA: Diagnosis not present

## 2016-07-24 DIAGNOSIS — F17200 Nicotine dependence, unspecified, uncomplicated: Secondary | ICD-10-CM | POA: Diagnosis not present

## 2016-07-24 DIAGNOSIS — C4911 Malignant neoplasm of connective and soft tissue of right upper limb, including shoulder: Secondary | ICD-10-CM | POA: Diagnosis not present

## 2016-07-24 DIAGNOSIS — M84511D Pathological fracture in neoplastic disease, right shoulder, subsequent encounter for fracture with routine healing: Secondary | ICD-10-CM | POA: Diagnosis not present

## 2016-07-24 DIAGNOSIS — S51009A Unspecified open wound of unspecified elbow, initial encounter: Secondary | ICD-10-CM | POA: Diagnosis not present

## 2016-07-24 DIAGNOSIS — T8132XA Disruption of internal operation (surgical) wound, not elsewhere classified, initial encounter: Secondary | ICD-10-CM | POA: Diagnosis not present

## 2016-07-24 DIAGNOSIS — Z7982 Long term (current) use of aspirin: Secondary | ICD-10-CM | POA: Diagnosis not present

## 2016-07-27 DIAGNOSIS — C4911 Malignant neoplasm of connective and soft tissue of right upper limb, including shoulder: Secondary | ICD-10-CM | POA: Diagnosis not present

## 2016-07-27 DIAGNOSIS — M84511D Pathological fracture in neoplastic disease, right shoulder, subsequent encounter for fracture with routine healing: Secondary | ICD-10-CM | POA: Diagnosis not present

## 2016-07-27 DIAGNOSIS — T8132XA Disruption of internal operation (surgical) wound, not elsewhere classified, initial encounter: Secondary | ICD-10-CM | POA: Diagnosis not present

## 2016-07-27 DIAGNOSIS — F17200 Nicotine dependence, unspecified, uncomplicated: Secondary | ICD-10-CM | POA: Diagnosis not present

## 2016-07-27 DIAGNOSIS — Z7982 Long term (current) use of aspirin: Secondary | ICD-10-CM | POA: Diagnosis not present

## 2016-07-28 DIAGNOSIS — Z5189 Encounter for other specified aftercare: Secondary | ICD-10-CM | POA: Diagnosis not present

## 2016-07-28 DIAGNOSIS — Z7982 Long term (current) use of aspirin: Secondary | ICD-10-CM | POA: Diagnosis not present

## 2016-07-28 DIAGNOSIS — M84511D Pathological fracture in neoplastic disease, right shoulder, subsequent encounter for fracture with routine healing: Secondary | ICD-10-CM | POA: Diagnosis not present

## 2016-07-28 DIAGNOSIS — M6281 Muscle weakness (generalized): Secondary | ICD-10-CM | POA: Diagnosis not present

## 2016-07-28 DIAGNOSIS — C4911 Malignant neoplasm of connective and soft tissue of right upper limb, including shoulder: Secondary | ICD-10-CM | POA: Diagnosis not present

## 2016-07-28 DIAGNOSIS — C499 Malignant neoplasm of connective and soft tissue, unspecified: Secondary | ICD-10-CM | POA: Diagnosis not present

## 2016-07-28 DIAGNOSIS — T8132XA Disruption of internal operation (surgical) wound, not elsewhere classified, initial encounter: Secondary | ICD-10-CM | POA: Diagnosis not present

## 2016-07-28 DIAGNOSIS — F17200 Nicotine dependence, unspecified, uncomplicated: Secondary | ICD-10-CM | POA: Diagnosis not present

## 2016-07-29 DIAGNOSIS — T8132XA Disruption of internal operation (surgical) wound, not elsewhere classified, initial encounter: Secondary | ICD-10-CM | POA: Diagnosis not present

## 2016-07-29 DIAGNOSIS — F17200 Nicotine dependence, unspecified, uncomplicated: Secondary | ICD-10-CM | POA: Diagnosis not present

## 2016-07-29 DIAGNOSIS — M84511D Pathological fracture in neoplastic disease, right shoulder, subsequent encounter for fracture with routine healing: Secondary | ICD-10-CM | POA: Diagnosis not present

## 2016-07-29 DIAGNOSIS — C4911 Malignant neoplasm of connective and soft tissue of right upper limb, including shoulder: Secondary | ICD-10-CM | POA: Diagnosis not present

## 2016-07-29 DIAGNOSIS — Z7982 Long term (current) use of aspirin: Secondary | ICD-10-CM | POA: Diagnosis not present

## 2016-07-31 DIAGNOSIS — M84511D Pathological fracture in neoplastic disease, right shoulder, subsequent encounter for fracture with routine healing: Secondary | ICD-10-CM | POA: Diagnosis not present

## 2016-07-31 DIAGNOSIS — C4911 Malignant neoplasm of connective and soft tissue of right upper limb, including shoulder: Secondary | ICD-10-CM | POA: Diagnosis not present

## 2016-07-31 DIAGNOSIS — F17200 Nicotine dependence, unspecified, uncomplicated: Secondary | ICD-10-CM | POA: Diagnosis not present

## 2016-07-31 DIAGNOSIS — Z7982 Long term (current) use of aspirin: Secondary | ICD-10-CM | POA: Diagnosis not present

## 2016-07-31 DIAGNOSIS — T8132XA Disruption of internal operation (surgical) wound, not elsewhere classified, initial encounter: Secondary | ICD-10-CM | POA: Diagnosis not present

## 2016-08-01 DIAGNOSIS — Z7982 Long term (current) use of aspirin: Secondary | ICD-10-CM | POA: Diagnosis not present

## 2016-08-01 DIAGNOSIS — T8132XA Disruption of internal operation (surgical) wound, not elsewhere classified, initial encounter: Secondary | ICD-10-CM | POA: Diagnosis not present

## 2016-08-01 DIAGNOSIS — F17200 Nicotine dependence, unspecified, uncomplicated: Secondary | ICD-10-CM | POA: Diagnosis not present

## 2016-08-01 DIAGNOSIS — C4911 Malignant neoplasm of connective and soft tissue of right upper limb, including shoulder: Secondary | ICD-10-CM | POA: Diagnosis not present

## 2016-08-01 DIAGNOSIS — M84511D Pathological fracture in neoplastic disease, right shoulder, subsequent encounter for fracture with routine healing: Secondary | ICD-10-CM | POA: Diagnosis not present

## 2016-08-02 DIAGNOSIS — C4911 Malignant neoplasm of connective and soft tissue of right upper limb, including shoulder: Secondary | ICD-10-CM | POA: Diagnosis not present

## 2016-08-02 DIAGNOSIS — F17200 Nicotine dependence, unspecified, uncomplicated: Secondary | ICD-10-CM | POA: Diagnosis not present

## 2016-08-02 DIAGNOSIS — M84511D Pathological fracture in neoplastic disease, right shoulder, subsequent encounter for fracture with routine healing: Secondary | ICD-10-CM | POA: Diagnosis not present

## 2016-08-02 DIAGNOSIS — T8132XA Disruption of internal operation (surgical) wound, not elsewhere classified, initial encounter: Secondary | ICD-10-CM | POA: Diagnosis not present

## 2016-08-02 DIAGNOSIS — Z7982 Long term (current) use of aspirin: Secondary | ICD-10-CM | POA: Diagnosis not present

## 2016-08-03 DIAGNOSIS — C4911 Malignant neoplasm of connective and soft tissue of right upper limb, including shoulder: Secondary | ICD-10-CM | POA: Diagnosis not present

## 2016-08-03 DIAGNOSIS — F17200 Nicotine dependence, unspecified, uncomplicated: Secondary | ICD-10-CM | POA: Diagnosis not present

## 2016-08-03 DIAGNOSIS — Z7982 Long term (current) use of aspirin: Secondary | ICD-10-CM | POA: Diagnosis not present

## 2016-08-03 DIAGNOSIS — T8132XA Disruption of internal operation (surgical) wound, not elsewhere classified, initial encounter: Secondary | ICD-10-CM | POA: Diagnosis not present

## 2016-08-03 DIAGNOSIS — M84511D Pathological fracture in neoplastic disease, right shoulder, subsequent encounter for fracture with routine healing: Secondary | ICD-10-CM | POA: Diagnosis not present

## 2016-08-04 DIAGNOSIS — M84511D Pathological fracture in neoplastic disease, right shoulder, subsequent encounter for fracture with routine healing: Secondary | ICD-10-CM | POA: Diagnosis not present

## 2016-08-04 DIAGNOSIS — T8132XA Disruption of internal operation (surgical) wound, not elsewhere classified, initial encounter: Secondary | ICD-10-CM | POA: Diagnosis not present

## 2016-08-04 DIAGNOSIS — F17200 Nicotine dependence, unspecified, uncomplicated: Secondary | ICD-10-CM | POA: Diagnosis not present

## 2016-08-04 DIAGNOSIS — C4911 Malignant neoplasm of connective and soft tissue of right upper limb, including shoulder: Secondary | ICD-10-CM | POA: Diagnosis not present

## 2016-08-04 DIAGNOSIS — Z7982 Long term (current) use of aspirin: Secondary | ICD-10-CM | POA: Diagnosis not present

## 2016-08-07 DIAGNOSIS — C4911 Malignant neoplasm of connective and soft tissue of right upper limb, including shoulder: Secondary | ICD-10-CM | POA: Diagnosis not present

## 2016-08-07 DIAGNOSIS — F17200 Nicotine dependence, unspecified, uncomplicated: Secondary | ICD-10-CM | POA: Diagnosis not present

## 2016-08-07 DIAGNOSIS — Z7982 Long term (current) use of aspirin: Secondary | ICD-10-CM | POA: Diagnosis not present

## 2016-08-07 DIAGNOSIS — M84511D Pathological fracture in neoplastic disease, right shoulder, subsequent encounter for fracture with routine healing: Secondary | ICD-10-CM | POA: Diagnosis not present

## 2016-08-07 DIAGNOSIS — T8132XA Disruption of internal operation (surgical) wound, not elsewhere classified, initial encounter: Secondary | ICD-10-CM | POA: Diagnosis not present

## 2016-08-08 DIAGNOSIS — T8132XA Disruption of internal operation (surgical) wound, not elsewhere classified, initial encounter: Secondary | ICD-10-CM | POA: Diagnosis not present

## 2016-08-08 DIAGNOSIS — C4911 Malignant neoplasm of connective and soft tissue of right upper limb, including shoulder: Secondary | ICD-10-CM | POA: Diagnosis not present

## 2016-08-08 DIAGNOSIS — Z7982 Long term (current) use of aspirin: Secondary | ICD-10-CM | POA: Diagnosis not present

## 2016-08-08 DIAGNOSIS — M84511D Pathological fracture in neoplastic disease, right shoulder, subsequent encounter for fracture with routine healing: Secondary | ICD-10-CM | POA: Diagnosis not present

## 2016-08-08 DIAGNOSIS — F17200 Nicotine dependence, unspecified, uncomplicated: Secondary | ICD-10-CM | POA: Diagnosis not present

## 2016-08-09 DIAGNOSIS — Z7982 Long term (current) use of aspirin: Secondary | ICD-10-CM | POA: Diagnosis not present

## 2016-08-09 DIAGNOSIS — T8132XA Disruption of internal operation (surgical) wound, not elsewhere classified, initial encounter: Secondary | ICD-10-CM | POA: Diagnosis not present

## 2016-08-09 DIAGNOSIS — F17200 Nicotine dependence, unspecified, uncomplicated: Secondary | ICD-10-CM | POA: Diagnosis not present

## 2016-08-09 DIAGNOSIS — M84511D Pathological fracture in neoplastic disease, right shoulder, subsequent encounter for fracture with routine healing: Secondary | ICD-10-CM | POA: Diagnosis not present

## 2016-08-09 DIAGNOSIS — C4911 Malignant neoplasm of connective and soft tissue of right upper limb, including shoulder: Secondary | ICD-10-CM | POA: Diagnosis not present

## 2016-08-11 ENCOUNTER — Telehealth: Payer: Self-pay

## 2016-08-11 DIAGNOSIS — Z7982 Long term (current) use of aspirin: Secondary | ICD-10-CM | POA: Diagnosis not present

## 2016-08-11 DIAGNOSIS — M84511D Pathological fracture in neoplastic disease, right shoulder, subsequent encounter for fracture with routine healing: Secondary | ICD-10-CM | POA: Diagnosis not present

## 2016-08-11 DIAGNOSIS — F17200 Nicotine dependence, unspecified, uncomplicated: Secondary | ICD-10-CM | POA: Diagnosis not present

## 2016-08-11 DIAGNOSIS — T8132XA Disruption of internal operation (surgical) wound, not elsewhere classified, initial encounter: Secondary | ICD-10-CM | POA: Diagnosis not present

## 2016-08-11 DIAGNOSIS — C4911 Malignant neoplasm of connective and soft tissue of right upper limb, including shoulder: Secondary | ICD-10-CM | POA: Diagnosis not present

## 2016-08-11 NOTE — Telephone Encounter (Signed)
Please review-aa 

## 2016-08-11 NOTE — Telephone Encounter (Signed)
OK 

## 2016-08-11 NOTE — Telephone Encounter (Signed)
Patricia Casey with [name]

## 2016-08-11 NOTE — Telephone Encounter (Signed)
Denyse Amass from [facility] is calling requesting verbal order to continue care for patient 3x a week for the next 3 weeks for wound care. Please review and advise.KW

## 2016-08-14 DIAGNOSIS — Z7982 Long term (current) use of aspirin: Secondary | ICD-10-CM | POA: Diagnosis not present

## 2016-08-14 DIAGNOSIS — C4911 Malignant neoplasm of connective and soft tissue of right upper limb, including shoulder: Secondary | ICD-10-CM | POA: Diagnosis not present

## 2016-08-14 DIAGNOSIS — M84511D Pathological fracture in neoplastic disease, right shoulder, subsequent encounter for fracture with routine healing: Secondary | ICD-10-CM | POA: Diagnosis not present

## 2016-08-14 DIAGNOSIS — T8132XA Disruption of internal operation (surgical) wound, not elsewhere classified, initial encounter: Secondary | ICD-10-CM | POA: Diagnosis not present

## 2016-08-14 DIAGNOSIS — F17200 Nicotine dependence, unspecified, uncomplicated: Secondary | ICD-10-CM | POA: Diagnosis not present

## 2016-08-15 ENCOUNTER — Telehealth: Payer: Self-pay | Admitting: Family Medicine

## 2016-08-15 DIAGNOSIS — T8132XA Disruption of internal operation (surgical) wound, not elsewhere classified, initial encounter: Secondary | ICD-10-CM | POA: Diagnosis not present

## 2016-08-15 DIAGNOSIS — M84511D Pathological fracture in neoplastic disease, right shoulder, subsequent encounter for fracture with routine healing: Secondary | ICD-10-CM | POA: Diagnosis not present

## 2016-08-15 DIAGNOSIS — Z483 Aftercare following surgery for neoplasm: Secondary | ICD-10-CM | POA: Diagnosis not present

## 2016-08-15 DIAGNOSIS — M84512D Pathological fracture in neoplastic disease, left shoulder, subsequent encounter for fracture with routine healing: Secondary | ICD-10-CM | POA: Diagnosis not present

## 2016-08-15 DIAGNOSIS — Z7982 Long term (current) use of aspirin: Secondary | ICD-10-CM | POA: Diagnosis not present

## 2016-08-15 DIAGNOSIS — F17200 Nicotine dependence, unspecified, uncomplicated: Secondary | ICD-10-CM | POA: Diagnosis not present

## 2016-08-15 DIAGNOSIS — C4911 Malignant neoplasm of connective and soft tissue of right upper limb, including shoulder: Secondary | ICD-10-CM | POA: Diagnosis not present

## 2016-08-15 NOTE — Telephone Encounter (Signed)
Patricia Casey is faxing an original plan of care again to be signed and sent back to them.  She said the original was sent 07/12/16.  They state then never recd it back   [name]

## 2016-08-15 NOTE — Telephone Encounter (Signed)
Patricia Casey called back needing her home health cert and plan of care signed.  Per Mariann Laster at [facility].  She stated it was faxed 07/12/16  She will fax again.  teri

## 2016-08-16 DIAGNOSIS — T8132XA Disruption of internal operation (surgical) wound, not elsewhere classified, initial encounter: Secondary | ICD-10-CM | POA: Diagnosis not present

## 2016-08-16 DIAGNOSIS — C4911 Malignant neoplasm of connective and soft tissue of right upper limb, including shoulder: Secondary | ICD-10-CM | POA: Diagnosis not present

## 2016-08-16 DIAGNOSIS — F17200 Nicotine dependence, unspecified, uncomplicated: Secondary | ICD-10-CM | POA: Diagnosis not present

## 2016-08-16 DIAGNOSIS — Z7982 Long term (current) use of aspirin: Secondary | ICD-10-CM | POA: Diagnosis not present

## 2016-08-16 DIAGNOSIS — M84511D Pathological fracture in neoplastic disease, right shoulder, subsequent encounter for fracture with routine healing: Secondary | ICD-10-CM | POA: Diagnosis not present

## 2016-08-18 DIAGNOSIS — T8132XA Disruption of internal operation (surgical) wound, not elsewhere classified, initial encounter: Secondary | ICD-10-CM | POA: Diagnosis not present

## 2016-08-18 DIAGNOSIS — F17200 Nicotine dependence, unspecified, uncomplicated: Secondary | ICD-10-CM | POA: Diagnosis not present

## 2016-08-18 DIAGNOSIS — C4911 Malignant neoplasm of connective and soft tissue of right upper limb, including shoulder: Secondary | ICD-10-CM | POA: Diagnosis not present

## 2016-08-18 DIAGNOSIS — Z7982 Long term (current) use of aspirin: Secondary | ICD-10-CM | POA: Diagnosis not present

## 2016-08-18 DIAGNOSIS — M84511D Pathological fracture in neoplastic disease, right shoulder, subsequent encounter for fracture with routine healing: Secondary | ICD-10-CM | POA: Diagnosis not present

## 2016-08-21 DIAGNOSIS — Z7982 Long term (current) use of aspirin: Secondary | ICD-10-CM | POA: Diagnosis not present

## 2016-08-21 DIAGNOSIS — Z5189 Encounter for other specified aftercare: Secondary | ICD-10-CM | POA: Diagnosis not present

## 2016-08-21 DIAGNOSIS — C4911 Malignant neoplasm of connective and soft tissue of right upper limb, including shoulder: Secondary | ICD-10-CM | POA: Diagnosis not present

## 2016-08-21 DIAGNOSIS — M84511D Pathological fracture in neoplastic disease, right shoulder, subsequent encounter for fracture with routine healing: Secondary | ICD-10-CM | POA: Diagnosis not present

## 2016-08-21 DIAGNOSIS — T8132XA Disruption of internal operation (surgical) wound, not elsewhere classified, initial encounter: Secondary | ICD-10-CM | POA: Diagnosis not present

## 2016-08-21 DIAGNOSIS — S51009A Unspecified open wound of unspecified elbow, initial encounter: Secondary | ICD-10-CM | POA: Diagnosis not present

## 2016-08-21 DIAGNOSIS — F17200 Nicotine dependence, unspecified, uncomplicated: Secondary | ICD-10-CM | POA: Diagnosis not present

## 2016-08-23 DIAGNOSIS — M84511D Pathological fracture in neoplastic disease, right shoulder, subsequent encounter for fracture with routine healing: Secondary | ICD-10-CM | POA: Diagnosis not present

## 2016-08-23 DIAGNOSIS — T8132XA Disruption of internal operation (surgical) wound, not elsewhere classified, initial encounter: Secondary | ICD-10-CM | POA: Diagnosis not present

## 2016-08-23 DIAGNOSIS — S51009A Unspecified open wound of unspecified elbow, initial encounter: Secondary | ICD-10-CM | POA: Diagnosis not present

## 2016-08-23 DIAGNOSIS — Z5189 Encounter for other specified aftercare: Secondary | ICD-10-CM | POA: Diagnosis not present

## 2016-08-23 DIAGNOSIS — Z7982 Long term (current) use of aspirin: Secondary | ICD-10-CM | POA: Diagnosis not present

## 2016-08-23 DIAGNOSIS — F17200 Nicotine dependence, unspecified, uncomplicated: Secondary | ICD-10-CM | POA: Diagnosis not present

## 2016-08-23 DIAGNOSIS — C4911 Malignant neoplasm of connective and soft tissue of right upper limb, including shoulder: Secondary | ICD-10-CM | POA: Diagnosis not present

## 2016-08-25 DIAGNOSIS — M84511D Pathological fracture in neoplastic disease, right shoulder, subsequent encounter for fracture with routine healing: Secondary | ICD-10-CM | POA: Diagnosis not present

## 2016-08-25 DIAGNOSIS — F17200 Nicotine dependence, unspecified, uncomplicated: Secondary | ICD-10-CM | POA: Diagnosis not present

## 2016-08-25 DIAGNOSIS — C4911 Malignant neoplasm of connective and soft tissue of right upper limb, including shoulder: Secondary | ICD-10-CM | POA: Diagnosis not present

## 2016-08-25 DIAGNOSIS — Z7982 Long term (current) use of aspirin: Secondary | ICD-10-CM | POA: Diagnosis not present

## 2016-08-25 DIAGNOSIS — T8132XA Disruption of internal operation (surgical) wound, not elsewhere classified, initial encounter: Secondary | ICD-10-CM | POA: Diagnosis not present

## 2016-08-28 DIAGNOSIS — T8132XA Disruption of internal operation (surgical) wound, not elsewhere classified, initial encounter: Secondary | ICD-10-CM | POA: Diagnosis not present

## 2016-08-28 DIAGNOSIS — M6281 Muscle weakness (generalized): Secondary | ICD-10-CM | POA: Diagnosis not present

## 2016-08-28 DIAGNOSIS — F17200 Nicotine dependence, unspecified, uncomplicated: Secondary | ICD-10-CM | POA: Diagnosis not present

## 2016-08-28 DIAGNOSIS — C4911 Malignant neoplasm of connective and soft tissue of right upper limb, including shoulder: Secondary | ICD-10-CM | POA: Diagnosis not present

## 2016-08-28 DIAGNOSIS — Z5189 Encounter for other specified aftercare: Secondary | ICD-10-CM | POA: Diagnosis not present

## 2016-08-28 DIAGNOSIS — Z7982 Long term (current) use of aspirin: Secondary | ICD-10-CM | POA: Diagnosis not present

## 2016-08-28 DIAGNOSIS — M84511D Pathological fracture in neoplastic disease, right shoulder, subsequent encounter for fracture with routine healing: Secondary | ICD-10-CM | POA: Diagnosis not present

## 2016-08-28 DIAGNOSIS — C499 Malignant neoplasm of connective and soft tissue, unspecified: Secondary | ICD-10-CM | POA: Diagnosis not present

## 2016-08-30 DIAGNOSIS — F17200 Nicotine dependence, unspecified, uncomplicated: Secondary | ICD-10-CM | POA: Diagnosis not present

## 2016-08-30 DIAGNOSIS — S51001S Unspecified open wound of right elbow, sequela: Secondary | ICD-10-CM | POA: Diagnosis not present

## 2016-08-30 DIAGNOSIS — C4911 Malignant neoplasm of connective and soft tissue of right upper limb, including shoulder: Secondary | ICD-10-CM | POA: Diagnosis not present

## 2016-08-30 DIAGNOSIS — M84511D Pathological fracture in neoplastic disease, right shoulder, subsequent encounter for fracture with routine healing: Secondary | ICD-10-CM | POA: Diagnosis not present

## 2016-08-30 DIAGNOSIS — T8132XA Disruption of internal operation (surgical) wound, not elsewhere classified, initial encounter: Secondary | ICD-10-CM | POA: Diagnosis not present

## 2016-08-30 DIAGNOSIS — Z7982 Long term (current) use of aspirin: Secondary | ICD-10-CM | POA: Diagnosis not present

## 2016-09-01 ENCOUNTER — Telehealth: Payer: Self-pay

## 2016-09-01 DIAGNOSIS — C4911 Malignant neoplasm of connective and soft tissue of right upper limb, including shoulder: Secondary | ICD-10-CM | POA: Diagnosis not present

## 2016-09-01 DIAGNOSIS — F17200 Nicotine dependence, unspecified, uncomplicated: Secondary | ICD-10-CM | POA: Diagnosis not present

## 2016-09-01 DIAGNOSIS — Z5189 Encounter for other specified aftercare: Secondary | ICD-10-CM | POA: Diagnosis not present

## 2016-09-01 DIAGNOSIS — T8132XA Disruption of internal operation (surgical) wound, not elsewhere classified, initial encounter: Secondary | ICD-10-CM | POA: Diagnosis not present

## 2016-09-01 DIAGNOSIS — Z7982 Long term (current) use of aspirin: Secondary | ICD-10-CM | POA: Diagnosis not present

## 2016-09-01 DIAGNOSIS — S51009A Unspecified open wound of unspecified elbow, initial encounter: Secondary | ICD-10-CM | POA: Diagnosis not present

## 2016-09-01 DIAGNOSIS — M84511D Pathological fracture in neoplastic disease, right shoulder, subsequent encounter for fracture with routine healing: Secondary | ICD-10-CM | POA: Diagnosis not present

## 2016-09-01 NOTE — Telephone Encounter (Signed)
Advised Jesse as below.  

## 2016-09-01 NOTE — Telephone Encounter (Signed)
Patricia Casey from home health is requesting a verbal order for two more weeks of home visits.  (Pt's plastic surgeon has her on a wound vac.)    3 visits next weeks 2 visits the week after.   Thanks,   -Mickel Baas

## 2016-09-01 NOTE — Telephone Encounter (Signed)
That's fine

## 2016-09-04 DIAGNOSIS — Z7982 Long term (current) use of aspirin: Secondary | ICD-10-CM | POA: Diagnosis not present

## 2016-09-04 DIAGNOSIS — C4911 Malignant neoplasm of connective and soft tissue of right upper limb, including shoulder: Secondary | ICD-10-CM | POA: Diagnosis not present

## 2016-09-04 DIAGNOSIS — M84511D Pathological fracture in neoplastic disease, right shoulder, subsequent encounter for fracture with routine healing: Secondary | ICD-10-CM | POA: Diagnosis not present

## 2016-09-04 DIAGNOSIS — F17200 Nicotine dependence, unspecified, uncomplicated: Secondary | ICD-10-CM | POA: Diagnosis not present

## 2016-09-04 DIAGNOSIS — T8132XA Disruption of internal operation (surgical) wound, not elsewhere classified, initial encounter: Secondary | ICD-10-CM | POA: Diagnosis not present

## 2016-09-06 DIAGNOSIS — T8132XA Disruption of internal operation (surgical) wound, not elsewhere classified, initial encounter: Secondary | ICD-10-CM | POA: Diagnosis not present

## 2016-09-06 DIAGNOSIS — C4911 Malignant neoplasm of connective and soft tissue of right upper limb, including shoulder: Secondary | ICD-10-CM | POA: Diagnosis not present

## 2016-09-06 DIAGNOSIS — F17200 Nicotine dependence, unspecified, uncomplicated: Secondary | ICD-10-CM | POA: Diagnosis not present

## 2016-09-06 DIAGNOSIS — Z7982 Long term (current) use of aspirin: Secondary | ICD-10-CM | POA: Diagnosis not present

## 2016-09-06 DIAGNOSIS — M84511D Pathological fracture in neoplastic disease, right shoulder, subsequent encounter for fracture with routine healing: Secondary | ICD-10-CM | POA: Diagnosis not present

## 2016-09-08 DIAGNOSIS — Z7982 Long term (current) use of aspirin: Secondary | ICD-10-CM | POA: Diagnosis not present

## 2016-09-08 DIAGNOSIS — C4911 Malignant neoplasm of connective and soft tissue of right upper limb, including shoulder: Secondary | ICD-10-CM | POA: Diagnosis not present

## 2016-09-08 DIAGNOSIS — F17200 Nicotine dependence, unspecified, uncomplicated: Secondary | ICD-10-CM | POA: Diagnosis not present

## 2016-09-08 DIAGNOSIS — T8132XA Disruption of internal operation (surgical) wound, not elsewhere classified, initial encounter: Secondary | ICD-10-CM | POA: Diagnosis not present

## 2016-09-08 DIAGNOSIS — M84511D Pathological fracture in neoplastic disease, right shoulder, subsequent encounter for fracture with routine healing: Secondary | ICD-10-CM | POA: Diagnosis not present

## 2016-09-11 DIAGNOSIS — C4911 Malignant neoplasm of connective and soft tissue of right upper limb, including shoulder: Secondary | ICD-10-CM | POA: Diagnosis not present

## 2016-09-11 DIAGNOSIS — T8132XA Disruption of internal operation (surgical) wound, not elsewhere classified, initial encounter: Secondary | ICD-10-CM | POA: Diagnosis not present

## 2016-09-11 DIAGNOSIS — F17200 Nicotine dependence, unspecified, uncomplicated: Secondary | ICD-10-CM | POA: Diagnosis not present

## 2016-09-11 DIAGNOSIS — M84511D Pathological fracture in neoplastic disease, right shoulder, subsequent encounter for fracture with routine healing: Secondary | ICD-10-CM | POA: Diagnosis not present

## 2016-09-11 DIAGNOSIS — Z7982 Long term (current) use of aspirin: Secondary | ICD-10-CM | POA: Diagnosis not present

## 2016-09-12 ENCOUNTER — Telehealth: Payer: Self-pay

## 2016-09-12 NOTE — Telephone Encounter (Signed)
Patricia Casey with Patricia Casey is calling requesting information on any recent osteoporosis screening vs OP medication. She states she has sent faxes about this, without response. CB# [phone]

## 2016-09-13 NOTE — Telephone Encounter (Signed)
Please review. Do you remember seeing any faxes about this patient?

## 2016-09-13 NOTE — Telephone Encounter (Signed)
I don't remember seeing any faxes. But I don't have time to fill out them out anyway.

## 2016-09-15 DIAGNOSIS — T8132XA Disruption of internal operation (surgical) wound, not elsewhere classified, initial encounter: Secondary | ICD-10-CM | POA: Diagnosis not present

## 2016-09-15 DIAGNOSIS — Z7982 Long term (current) use of aspirin: Secondary | ICD-10-CM | POA: Diagnosis not present

## 2016-09-15 DIAGNOSIS — F17200 Nicotine dependence, unspecified, uncomplicated: Secondary | ICD-10-CM | POA: Diagnosis not present

## 2016-09-15 DIAGNOSIS — M84511D Pathological fracture in neoplastic disease, right shoulder, subsequent encounter for fracture with routine healing: Secondary | ICD-10-CM | POA: Diagnosis not present

## 2016-09-15 DIAGNOSIS — C4911 Malignant neoplasm of connective and soft tissue of right upper limb, including shoulder: Secondary | ICD-10-CM | POA: Diagnosis not present

## 2016-09-25 DIAGNOSIS — M6281 Muscle weakness (generalized): Secondary | ICD-10-CM | POA: Diagnosis not present

## 2016-09-25 DIAGNOSIS — C499 Malignant neoplasm of connective and soft tissue, unspecified: Secondary | ICD-10-CM | POA: Diagnosis not present

## 2016-09-25 DIAGNOSIS — Z5189 Encounter for other specified aftercare: Secondary | ICD-10-CM | POA: Diagnosis not present

## 2016-10-09 ENCOUNTER — Other Ambulatory Visit: Payer: Self-pay | Admitting: Physician Assistant

## 2016-10-09 DIAGNOSIS — I10 Essential (primary) hypertension: Secondary | ICD-10-CM

## 2016-10-10 NOTE — Telephone Encounter (Signed)
Last ov 06/30/16  Last filled 02/15/16 Please review. Thank you. sd

## 2016-10-11 DIAGNOSIS — C499 Malignant neoplasm of connective and soft tissue, unspecified: Secondary | ICD-10-CM | POA: Diagnosis not present

## 2016-10-25 DIAGNOSIS — H25813 Combined forms of age-related cataract, bilateral: Secondary | ICD-10-CM | POA: Diagnosis not present

## 2016-10-25 DIAGNOSIS — H524 Presbyopia: Secondary | ICD-10-CM | POA: Diagnosis not present

## 2016-10-26 DIAGNOSIS — C499 Malignant neoplasm of connective and soft tissue, unspecified: Secondary | ICD-10-CM | POA: Diagnosis not present

## 2016-10-26 DIAGNOSIS — M6281 Muscle weakness (generalized): Secondary | ICD-10-CM | POA: Diagnosis not present

## 2016-10-26 DIAGNOSIS — Z5189 Encounter for other specified aftercare: Secondary | ICD-10-CM | POA: Diagnosis not present

## 2016-11-01 ENCOUNTER — Telehealth: Payer: Self-pay | Admitting: Family Medicine

## 2016-11-01 ENCOUNTER — Ambulatory Visit: Payer: Self-pay | Admitting: Family Medicine

## 2016-11-01 NOTE — Telephone Encounter (Signed)
Left detailed message on vm stating that there no record of pt having bone density.

## 2016-11-01 NOTE — Telephone Encounter (Signed)
Patricia Casey with [facility] Casey is requesting a call back to discuss if pt has has a bone density.  CB#[phone]/MW

## 2016-11-02 NOTE — Telephone Encounter (Signed)
Melissa with [employer] called back wanting to know if Dr. Caryn Section planned on ordering a bone density test? She states patient needs to have one done before Nov 17, 2016 to be in compliance.

## 2016-11-02 NOTE — Telephone Encounter (Signed)
Ok to order bone density test. Dx estrogen deficiency.

## 2016-11-02 NOTE — Telephone Encounter (Signed)
I called patient and advised her that she is due for a bone density test. Patient states she is still under the care of her cancer doctor, Dr. Sherlynn Stalls and prefers to hold off on having this done until after her next visit with him on 11/21/2016. Patient states she would call us back and let us know when she is ready to have this done. I called Melissa from [facility] and left a detailed voice message.

## 2016-11-21 DIAGNOSIS — Z9889 Other specified postprocedural states: Secondary | ICD-10-CM | POA: Diagnosis not present

## 2016-11-21 DIAGNOSIS — C499 Malignant neoplasm of connective and soft tissue, unspecified: Secondary | ICD-10-CM | POA: Diagnosis not present

## 2016-11-21 DIAGNOSIS — M25421 Effusion, right elbow: Secondary | ICD-10-CM | POA: Diagnosis not present

## 2016-11-21 DIAGNOSIS — J9811 Atelectasis: Secondary | ICD-10-CM | POA: Diagnosis not present

## 2016-11-21 DIAGNOSIS — R918 Other nonspecific abnormal finding of lung field: Secondary | ICD-10-CM | POA: Diagnosis not present

## 2016-11-21 DIAGNOSIS — J984 Other disorders of lung: Secondary | ICD-10-CM | POA: Diagnosis not present

## 2016-11-25 DIAGNOSIS — Z5189 Encounter for other specified aftercare: Secondary | ICD-10-CM | POA: Diagnosis not present

## 2016-11-25 DIAGNOSIS — M6281 Muscle weakness (generalized): Secondary | ICD-10-CM | POA: Diagnosis not present

## 2016-11-25 DIAGNOSIS — C499 Malignant neoplasm of connective and soft tissue, unspecified: Secondary | ICD-10-CM | POA: Diagnosis not present

## 2016-11-29 DIAGNOSIS — R2231 Localized swelling, mass and lump, right upper limb: Secondary | ICD-10-CM | POA: Diagnosis not present

## 2016-11-29 DIAGNOSIS — C499 Malignant neoplasm of connective and soft tissue, unspecified: Secondary | ICD-10-CM | POA: Diagnosis not present

## 2016-12-19 ENCOUNTER — Ambulatory Visit: Payer: Medicare HMO | Attending: Orthopedic Surgery

## 2016-12-19 DIAGNOSIS — M6281 Muscle weakness (generalized): Secondary | ICD-10-CM | POA: Diagnosis not present

## 2016-12-19 DIAGNOSIS — M25621 Stiffness of right elbow, not elsewhere classified: Secondary | ICD-10-CM | POA: Insufficient documentation

## 2016-12-20 NOTE — Therapy (Signed)
[facility] [address] [place], [address] Phone: [phone]   Fax:  [phone]  Physical Therapy Evaluation  Patient Details  Name: Patricia Casey MRN: [id number] Date of Birth: 1950-09-17 Referring Provider: Dr. Sherlynn Stalls  Encounter Date: 12/19/2016      PT End of Session - 12/20/16 0853    Visit Number 1   Number of Visits 17   Date for PT Re-Evaluation 02/14/17   Authorization Type 1 / 10 G Code   PT Start Time 5053   PT Stop Time 1615   PT Time Calculation (min) 60 min   Activity Tolerance Patient tolerated treatment well   Behavior During Therapy [facility] for tasks assessed/performed      Past Medical History:  Diagnosis Date  . Allergy   . Hypertension     Past Surgical History:  Procedure Laterality Date  . HERNIATED NUCLEUS PULPOSUS  1997   Back surgery     There were no vitals filed for this visit.       Subjective Assessment - 12/19/16 1530    Subjective Patient presents with R elbow stiffness s/p sarcoma excision on 05/29/17. Patient demonstrates increased difficulty with eating, washing hair, writing, brushing her teeth, and lifting her arm above her head. Patient reports no pain. Patient reports her functioning has been musch improved since the surgery but still has limitations. Decreased balance with standing and walking. Patient demonstrates increased difficulty with transfering from sitting to standing    Patient is accompained by: Family member  husband   Pertinent History Was in elbow extension brace: from May 29 2016- beginning on May; Sarcoma excision on May 29 2016; PMH: Denies any history of PMH;    Limitations Lifting;Writing   Patient Stated Goals To be able to bend her elbow   Currently in Pain? No/denies            [facility] PT Assessment - 12/19/16 1525      Assessment   Medical Diagnosis R Elbow ROM   Referring Provider Dr. Sherlynn Stalls   Onset Date/Surgical  Date 05/29/16   Hand Dominance Right   Next MD Visit unknown   Prior Therapy home health PT     Balance Screen   Has the patient fallen in the past 6 months No   Has the patient had a decrease in activity level because of a fear of falling?  Yes   Is the patient reluctant to leave their home because of a fear of falling?  Yes     [place] residence     Prior Function   Level of Independence Independent   Vocation Retired   Biomedical scientist N/A   Leisure Reading and computer use: playing games     Cognition   Overall Cognitive Status Within Functional Limits for tasks assessed     Observation/Other Assessments   Other Surveys  Other Surveys   Quick DASH  48%     Sensation   Light Touch Appears Intact     Posture/Postural Control   Posture/Postural Control Postural limitations   Posture Comments Forward rounded shoulders, FHP     ROM / Strength   AROM / PROM / Strength AROM;Strength     AROM   AROM Assessment Site Shoulder;Elbow;Forearm;Wrist   Right/Left Shoulder Right;Left   Right Shoulder Flexion 160 Degrees   Right Shoulder ABduction 160 Degrees   Left Shoulder Flexion 175 Degrees   Left  Shoulder ABduction 175 Degrees   Right/Left Elbow Right;Left   Right Elbow Flexion 55   Right Elbow Extension 2   Left Elbow Flexion 120   Left Elbow Extension 0   Right/Left Forearm Right;Left   Right Forearm Pronation 70 Degrees   Right Forearm Supination 65 Degrees   Left Forearm Pronation 80 Degrees   Left Forearm Supination 80 Degrees   Right/Left Wrist Right   Right Wrist Extension 30 Degrees   Right Wrist Flexion 40 Degrees     Strength   Strength Assessment Site Shoulder;Elbow;Forearm;Wrist   Right/Left Shoulder Right;Left   Right Shoulder Flexion 4/5   Right Shoulder Internal Rotation 4-/5   Right Shoulder External Rotation 4-/5   Left Shoulder Flexion 4/5   Left Shoulder Internal Rotation 4/5   Left Shoulder  External Rotation 4/5   Right/Left Elbow Right;Left   Right Elbow Flexion 3/5  within available range    Right Elbow Extension 3+/5   Left Elbow Flexion 4+/5   Left Elbow Extension 4+/5     Palpation   Palpation comment Increased scar adhesion along the olcranon of the R elbow, slight TTP along her distal triceps tendon; hypomobile Radial/ulnar joint, ulnar/humeral joint, and radial humeral joint       Objective measurements completed on examination: See above findings.    Treatment: Elbow extension hangs with 5# weight -- 2x20sec Elbow flexion against YTB in sitting --2 x20 Elbow supination/pronation with 5# weight -- x 20   Patient demonstrates increased elbow stiffness with performance of exercises           PT Education - 12/19/16 1851    Education provided Yes   Education Details HEP: Elbow extension in supine, biceps curl with yellow band , scar mobilization   Person(s) Educated Patient   Methods Explanation;Demonstration   Comprehension Verbalized understanding;Returned demonstration             PT Long Term Goals - 12/20/16 0903      PT LONG TERM GOAL #1   Title Patient will be independent in HEP to continue benefits of therapy after discharge   Baseline Dependent with exercise performance and progression   Time 8   Period Weeks   Status New     PT LONG TERM GOAL #2   Title Patient will improve elbow flexion AROM to 100 to allow for performance of writiing and lifting activities   Baseline Flexion: 55deg   Time 8   Period Weeks   Status New     PT LONG TERM GOAL #3   Title Patient will be able to writing without difficulty using the R UE to allow for the signing of documents   Baseline unable to write   Time 8   Period Weeks   Status New     PT LONG TERM GOAL #4   Title Patient will improve QuickDASH to <20% to demonstrate significant improvement in lifitng activities and greater ability to perform chores around the house.   Baseline  QuickDASH: 47%   Time 8   Period Weeks   Status New                Plan - 12/20/16 0854    Clinical Impression Statement Pt is 66 yo right hand dominant female presenting with increased R elbow stiffness s/p elbow sarcoma excision in Nov 2017. Patient demonstrates significant elbow flexion AROM limitations limiting use of entire UE. Patient is unable to perform functional activities such as writing; patient demonstrates  increased difficulty with seperating shoulder and elbow movement. Patient will benefit from further skilled therapy focused on improving elbow AROM and strength to return to prior level of function.     History and Personal Factors relevant to plan of care: 5 months of bearing a brace locking elbow into full extension   Clinical Presentation Stable   Clinical Decision Making Low   Rehab Potential Fair   Clinical Impairments Affecting Rehab Potential (+) Highly motivated (-) current everyday smoker, CA history   PT Frequency 2x / week   PT Duration 8 weeks   PT Treatment/Interventions Electrical Stimulation;Iontophoresis 4mg /ml Dexamethasone;Moist Heat;Ultrasound;Parrafin;Functional mobility training;Therapeutic activities;Therapeutic exercise;Patient/family education;Neuromuscular re-education;Dry needling;Manual techniques;Passive range of motion   PT Next Visit Plan Progress elbow manual therapy and stretching   Consulted and Agree with Plan of Care Patient      Patient will benefit from skilled therapeutic intervention in order to improve the following deficits and impairments:     Visit Diagnosis: Stiffness of right elbow, not elsewhere classified - Plan: PT plan of care cert/re-cert  Muscle weakness (generalized) - Plan: PT plan of care cert/re-cert      G-Codes - 08/65/78 0907    Functional Assessment Tool Used (Outpatient Only) QuickDash, MMT, AROM, clinical judgment   Functional Limitation Carrying, moving and handling objects   Carrying, Moving and  Handling Objects Current Status (I6962) At least 40 percent but less than 60 percent impaired, limited or restricted   Carrying, Moving and Handling Objects Goal Status (X5284) At least 1 percent but less than 20 percent impaired, limited or restricted       Problem List Patient Active Problem List   Diagnosis Date Noted  . Lower extremity weakness 06/30/2016  . 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 Dermatitis, eczematoid 07/27/2015  . Abnormal liver enzymes 07/27/2015  . Fatigue 07/27/2015  . Esophagitis, reflux 07/27/2015  . H/O alcohol abuse 07/27/2015  . Hearing loss of left ear 07/27/2015  . Hypotension, postural 07/27/2015  . Fast heart beat 07/27/2015  . Compulsive tobacco user syndrome 07/27/2015  . Allergic rhinitis 03/16/2009  . Hypercholesteremia 03/30/2004  . BP (high blood pressure) 08/10/1993    Blythe Stanford, PT DPT 12/20/2016, 9:12 AM  [facility] [facility] [address] [place], [address] Phone: [phone]   Fax:  [phone]  Name: Patricia Casey MRN: [id number] Date of Birth: 1950/08/04

## 2016-12-25 ENCOUNTER — Ambulatory Visit: Payer: Medicare HMO

## 2016-12-25 DIAGNOSIS — M25621 Stiffness of right elbow, not elsewhere classified: Secondary | ICD-10-CM | POA: Diagnosis not present

## 2016-12-25 DIAGNOSIS — M6281 Muscle weakness (generalized): Secondary | ICD-10-CM

## 2016-12-25 NOTE — Therapy (Signed)
[facility] [facility] [address], [place], [address] Phone: [phone]   Fax:  [phone]  Physical Therapy Treatment  Patient Details  Name: Patricia Casey MRN: [id number] Date of Birth: Nov 14, 1950 Referring Provider: Dr. Sherlynn Stalls  Encounter Date: 12/25/2016      PT End of Session - 12/25/16 1729    Visit Number 2   Number of Visits 17   Date for PT Re-Evaluation 02/14/17   Authorization Type 2 / 10 G Code   PT Start Time 1630   PT Stop Time 1937   PT Time Calculation (min) 45 min   Activity Tolerance Patient tolerated treatment well   Behavior During Therapy [facility] for tasks assessed/performed      Past Medical History:  Diagnosis Date  . Allergy   . Hypertension     Past Surgical History:  Procedure Laterality Date  . HERNIATED NUCLEUS PULPOSUS  1997   Back surgery     There were no vitals filed for this visit.      Subjective Assessment - 12/25/16 1707    Subjective Patient presents with no major changes since her previous session and states the exercise performance has been going well.    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 No/denies        TREATMENT: Manual Therapy: Radius-ulnar A-P mobilizations - 3 x 30sec, Distraction elbow flexion mobilizations - 2 x 30sec, ulnar-humeral A-P mobilizations - 2 x 30sec. Distraction mobilizations for elbow - 3 x 30sec; Patient positioned in supine during manual therapy  Therapeutic Exercise: One # weight elbow flexion in supine - x10 Manually resisted elbow flexion/extension - x 10  Leaning against the wall on forearms for greater AROM - x 10 with 3 sec holds B Shoulder ER against  RTB with patient in sitting - x20 B scapular retraction in sitting at [place] - x 10 5# Scapular retraction against YTB - x10   Patient responds well to therapy with improved elbow flexion AROM during the session.         PT Education - 12/25/16 1729    Education provided Yes   Education Details HEP: leaning on forearms at wall, scapular retraction against band   Person(s) Educated Patient   Methods Explanation;Demonstration   Comprehension Returned demonstration;Verbalized understanding             PT Long Term Goals - 12/20/16 0903      PT LONG TERM GOAL #1   Title Patient will be independent in HEP to continue benefits of therapy after discharge   Baseline Dependent with exercise performance and progression   Time 8   Period Weeks   Status New     PT LONG TERM GOAL #2   Title Patient will improve elbow flexion AROM to 100 to allow for performance of writiing and lifting activities   Baseline Flexion: 55deg   Time 8   Period Weeks   Status New     PT LONG TERM GOAL #3   Title Patient will be able to writing without difficulty using the R UE to allow for the signing of documents   Baseline unable to write  Time 8   Period Weeks   Status New     PT LONG TERM GOAL #4   Title Patient will improve QuickDASH to <20% to demonstrate significant improvement in lifitng activities and greater ability to perform chores around the house.   Baseline QuickDASH: 47%   Time 8   Period Weeks   Status New               Plan - 12/25/16 1730    Clinical Impression Statement Patient demonstrates improvement in elbow flexion AROM with ability to perform 65deg with overpressure indicating functional carryover between visits. Patient demonstrates decreased muscular coordination and requires frequent cueing on form and technique throughout session. Patient will benefit from further skilled therapy focused on improving limitations to return to prior level of function.    Rehab  Potential Fair   Clinical Impairments Affecting Rehab Potential (+) Highly motivated (-) current everyday smoker, CA history   PT Frequency 2x / week   PT Duration 8 weeks   PT Treatment/Interventions Electrical Stimulation;Iontophoresis 4mg /ml Dexamethasone;Moist Heat;Ultrasound;Parrafin;Functional mobility training;Therapeutic activities;Therapeutic exercise;Patient/family education;Neuromuscular re-education;Dry needling;Manual techniques;Passive range of motion   PT Next Visit Plan Progress elbow manual therapy and stretching   Consulted and Agree with Plan of Care Patient      Patient will benefit from skilled therapeutic intervention in order to improve the following deficits and impairments:     Visit Diagnosis: Stiffness of right elbow, not elsewhere classified  Muscle weakness (generalized)     Problem List Patient Active Problem List   Diagnosis Date Noted  . Lower extremity weakness 06/30/2016  . 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 Dermatitis, eczematoid 07/27/2015  . Abnormal liver enzymes 07/27/2015  . Fatigue 07/27/2015  . Esophagitis, reflux 07/27/2015  . H/O alcohol abuse 07/27/2015  . Hearing loss of left ear 07/27/2015  . Hypotension, postural 07/27/2015  . Fast heart beat 07/27/2015  . Compulsive tobacco user syndrome 07/27/2015  . Allergic rhinitis 03/16/2009  . Hypercholesteremia 03/30/2004  . BP (high blood pressure) 08/10/1993    Blythe Stanford, PT DPT 12/25/2016, 5:32 PM  [place] [facility] [address], [place], [address] Phone: [phone]   Fax:  [phone]  Name: Elexia Friedt MRN: [id number] Date of Birth: 1950/07/11

## 2016-12-26 DIAGNOSIS — M6281 Muscle weakness (generalized): Secondary | ICD-10-CM | POA: Diagnosis not present

## 2016-12-26 DIAGNOSIS — C499 Malignant neoplasm of connective and soft tissue, unspecified: Secondary | ICD-10-CM | POA: Diagnosis not present

## 2016-12-26 DIAGNOSIS — Z5189 Encounter for other specified aftercare: Secondary | ICD-10-CM | POA: Diagnosis not present

## 2016-12-27 ENCOUNTER — Ambulatory Visit: Payer: Medicare HMO

## 2016-12-28 ENCOUNTER — Telehealth: Payer: Self-pay | Admitting: Family Medicine

## 2017-01-01 ENCOUNTER — Ambulatory Visit: Payer: Medicare HMO

## 2017-01-03 ENCOUNTER — Ambulatory Visit: Payer: Medicare HMO

## 2017-01-03 DIAGNOSIS — M6281 Muscle weakness (generalized): Secondary | ICD-10-CM | POA: Diagnosis not present

## 2017-01-03 DIAGNOSIS — M25621 Stiffness of right elbow, not elsewhere classified: Secondary | ICD-10-CM

## 2017-01-03 NOTE — Therapy (Signed)
[facility] [address], [place], [address] Phone: [phone]   Fax:  [phone]  Physical Therapy Treatment  Patient Details  Name: Patricia Casey MRN: [id number] Date of Birth: 11/27/1950 Referring Provider: Dr. Sherlynn Stalls  Encounter Date: 01/03/2017      PT End of Session - 01/03/17 1559    Visit Number 3   Number of Visits 17   Date for PT Re-Evaluation 02/14/17   Authorization Type 3/ 10 G Code   PT Start Time 6962   PT Stop Time 1557   PT Time Calculation (min) 42 min   Activity Tolerance Patient tolerated treatment well   Behavior During Therapy [facility] for tasks assessed/performed      Past Medical History:  Diagnosis Date  . Allergy   . Hypertension     Past Surgical History:  Procedure Laterality Date  . HERNIATED NUCLEUS PULPOSUS  1997   Back surgery     There were no vitals filed for this visit.      Subjective Assessment - 01/03/17 1554    Subjective Patient reports she has been performing her HEP and states she feels her elbow function is improving.    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 No/denies        TREATMENT: Manual Therapy: Radius-ulnar A-P mobilizations - 3 x 30sec, Distraction elbow flexion mobilizations - 2 x 30sec, ulnar-humeral A-P mobilizations - 2 x 30sec. Distraction mobilizations for elbow - 3 x 30sec; Patient positioned in supine during manual therapy   Therapeutic Exercise: Two # weight elbow flexion in supine - x10 5 sec holds at top position  Manually resisted elbow flexion/extension - x 10; 5sec holds Manually assisted chest press - x10 with 4# weight Prone elbow flexion to address  ROM limitations in CKC-x10 prone press ups with therapist assist (Patient required max A to transfer from prone to sitting) Scapular retraction against RTB - x10  Seated Bicep curls against RTB - x 10  Leaning against the wall on forearms for greater AROM - x 10 with 3 sec holds   Patient responds well to therapy with improved elbow flexion AROM during the session.         PT Education - 01/03/17 1559    Education provided Yes   Education Details Reinforced HEP and importance of performing throuhgout the day   Person(s) Educated Patient   Methods Demonstration;Explanation   Comprehension Verbalized understanding;Returned demonstration             PT Long Term Goals - 12/20/16 0903      PT LONG TERM GOAL #1   Title Patient will be independent in HEP to continue benefits of therapy after discharge   Baseline Dependent with exercise performance and progression   Time 8   Period Weeks   Status New     PT LONG TERM GOAL #2   Title Patient will improve elbow flexion AROM to 100 to allow for performance of writiing and lifting activities   Baseline Flexion: 55deg   Time 8   Period Weeks   Status New     PT LONG TERM GOAL #3   Title  Patient will be able to writing without difficulty using the R UE to allow for the signing of documents   Baseline unable to write   Time 8   Period Weeks   Status New     PT LONG TERM GOAL #4   Title Patient will improve QuickDASH to <20% to demonstrate significant improvement in lifitng activities and greater ability to perform chores around the house.   Baseline QuickDASH: 47%   Time 8   Period Weeks   Status New               Plan - 01/03/17 1600    Clinical Impression Statement Patient demonstrates improvement in elbow flexion AROM with ability to perform 70deg of elbow flexion after performing exercises and manual therapy indicating functional carryover between visits. Patient demonstrates improvement in motor control within  her affected elbow with ability to seperate shoulder and elbow motions. Patient will benefit from further skilled therapy to return to prior level of function.    Rehab Potential Fair   Clinical Impairments Affecting Rehab Potential (+) Highly motivated (-) current everyday smoker, CA history   PT Frequency 2x / week   PT Duration 8 weeks   PT Treatment/Interventions Electrical Stimulation;Iontophoresis 4mg /ml Dexamethasone;Moist Heat;Ultrasound;Parrafin;Functional mobility training;Therapeutic activities;Therapeutic exercise;Patient/family education;Neuromuscular re-education;Dry needling;Manual techniques;Passive range of motion   PT Next Visit Plan Progress elbow manual therapy and stretching   Consulted and Agree with Plan of Care Patient      Patient will benefit from skilled therapeutic intervention in order to improve the following deficits and impairments:  Decreased coordination, Decreased range of motion, Decreased endurance, Decreased strength  Visit Diagnosis: Stiffness of right elbow, not elsewhere classified  Muscle weakness (generalized)     Problem List Patient Active Problem List   Diagnosis Date Noted  . Lower extremity weakness 06/30/2016  . 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 Dermatitis, eczematoid 07/27/2015  . Abnormal liver enzymes 07/27/2015  . Fatigue 07/27/2015  . Esophagitis, reflux 07/27/2015  . H/O alcohol abuse 07/27/2015  . Hearing loss of left ear 07/27/2015  . Hypotension, postural 07/27/2015  . Fast heart beat 07/27/2015  . Compulsive tobacco user syndrome 07/27/2015  . Allergic rhinitis 03/16/2009  . Hypercholesteremia 03/30/2004  . BP (high blood pressure) 08/10/1993    Blythe Stanford, PT DPT 01/03/2017, 4:03 PM  [facility] [address] [place], [address] Phone: [phone]   Fax:  [phone]  Name:  Patricia Casey MRN: [id number] Date of Birth: Nov 05, 1950

## 2017-01-08 ENCOUNTER — Ambulatory Visit: Payer: Medicare HMO

## 2017-01-11 ENCOUNTER — Ambulatory Visit: Payer: Medicare HMO

## 2017-01-15 ENCOUNTER — Ambulatory Visit: Payer: Medicare HMO | Attending: Orthopedic Surgery

## 2017-01-15 DIAGNOSIS — M6281 Muscle weakness (generalized): Secondary | ICD-10-CM | POA: Diagnosis not present

## 2017-01-15 DIAGNOSIS — M25621 Stiffness of right elbow, not elsewhere classified: Secondary | ICD-10-CM | POA: Insufficient documentation

## 2017-01-15 NOTE — Therapy (Signed)
[facility] [address], [place], [address] Phone: [phone]   Fax:  [phone]  Physical Therapy Treatment  Patient Details  Name: Patricia Casey MRN: [id number] Date of Birth: Mar 09, 1951 Referring Provider: Dr. Sherlynn Stalls  Encounter Date: 01/15/2017      PT End of Session - 01/15/17 1553    Visit Number 4   Number of Visits 17   Date for PT Re-Evaluation 02/14/17   Authorization Type 4/ 10 G Code   PT Start Time 0037   PT Stop Time 1600   PT Time Calculation (min) 43 min   Activity Tolerance Patient tolerated treatment well   Behavior During Therapy [facility] for tasks assessed/performed      Past Medical History:  Diagnosis Date  . Allergy   . Hypertension     Past Surgical History:  Procedure Laterality Date  . HERNIATED NUCLEUS PULPOSUS  1997   Back surgery     There were no vitals filed for this visit.      Subjective Assessment - 01/15/17 1545    Subjective Patient reports she was sick last week and thus missed all her appointments. Patient states she has not been performing as exercises as much as she should.    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 No/denies        TREATMENT: Manual Therapy: Radius-ulnar A-P mobilizations - 3 x 30sec, Distraction elbow flexion mobilizations - 2 x 30sec, ulnar-humeral A-P mobilizations - 2 x 30sec. Distraction mobilizations for elbow - 3 x 30sec; Patient positioned in supine during manual therapy; PROM extended holds in elbow flexion - 10 sec x 7   Therapeutic Exercise: Three # weight elbow flexion in supine - x10 5 sec holds at top position  Wall slides shoulder flexion with  pushing into elbow flexion while pressing into the wall - x 12 Leaning against the wall on forearms for greater AROM - x 10 with 3 sec holds Seated Bicep curls against RTB - x 10  Seated triceps extension against YTB - x 10  Patient responds well to therapy with improved elbow flexion AROM during the session.        PT Education - 01/15/17 1548    Education provided Yes   Education Details form/technique with exercise   Person(s) Educated Patient   Methods Explanation;Demonstration   Comprehension Verbalized understanding;Returned demonstration             PT Long Term Goals - 12/20/16 0903      PT LONG TERM GOAL #1   Title Patient will be independent in HEP to continue benefits of therapy after discharge   Baseline Dependent with exercise performance and progression   Time 8   Period Weeks   Status New     PT LONG TERM GOAL #2   Title Patient will improve elbow flexion AROM to 100 to allow for performance of writiing and lifting activities   Baseline Flexion: 55deg   Time 8   Period Weeks   Status New     PT LONG TERM GOAL #3   Title Patient will be able to writing without difficulty using the R UE  to allow for the signing of documents   Baseline unable to write   Time 8   Period Weeks   Status New     PT LONG TERM GOAL #4   Title Patient will improve QuickDASH to <20% to demonstrate significant improvement in lifitng activities and greater ability to perform chores around the house.   Baseline QuickDASH: 47%   Time 8   Period Weeks   Status New               Plan - 01/15/17 1554    Clinical Impression Statement Patient demonstrates improvement in elbow flexion today with greater ability to bend her elbow compared to previous visits indicating functional carryover between visits. Although improving, patient continues to demonstrate decreased coordination and will benefit from further skilled therapy to return to prior level of function.    Rehab  Potential Fair   Clinical Impairments Affecting Rehab Potential (+) Highly motivated (-) current everyday smoker, CA history   PT Frequency 2x / week   PT Duration 8 weeks   PT Treatment/Interventions Electrical Stimulation;Iontophoresis 4mg /ml Dexamethasone;Moist Heat;Ultrasound;Parrafin;Functional mobility training;Therapeutic activities;Therapeutic exercise;Patient/family education;Neuromuscular re-education;Dry needling;Manual techniques;Passive range of motion   PT Next Visit Plan Progress elbow manual therapy and stretching   Consulted and Agree with Plan of Care Patient      Patient will benefit from skilled therapeutic intervention in order to improve the following deficits and impairments:  Decreased coordination, Decreased range of motion, Decreased endurance, Decreased strength  Visit Diagnosis: Stiffness of right elbow, not elsewhere classified  Muscle weakness (generalized)     Problem List Patient Active Problem List   Diagnosis Date Noted  . Lower extremity weakness 06/30/2016  . 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 Dermatitis, eczematoid 07/27/2015  . Abnormal liver enzymes 07/27/2015  . Fatigue 07/27/2015  . Esophagitis, reflux 07/27/2015  . H/O alcohol abuse 07/27/2015  . Hearing loss of left ear 07/27/2015  . Hypotension, postural 07/27/2015  . Fast heart beat 07/27/2015  . Compulsive tobacco user syndrome 07/27/2015  . Allergic rhinitis 03/16/2009  . Hypercholesteremia 03/30/2004  . BP (high blood pressure) 08/10/1993    Blythe Stanford, PT DPT 01/15/2017, 4:16 PM  [facility] [address], [place], [address] Phone: [phone]   Fax:  [phone]  Name: Patricia Casey MRN: [id number] Date of Birth: 19-Jan-1951

## 2017-01-17 ENCOUNTER — Ambulatory Visit: Payer: Medicare HMO

## 2017-01-17 DIAGNOSIS — M6281 Muscle weakness (generalized): Secondary | ICD-10-CM | POA: Diagnosis not present

## 2017-01-17 DIAGNOSIS — M25621 Stiffness of right elbow, not elsewhere classified: Secondary | ICD-10-CM

## 2017-01-17 NOTE — Therapy (Signed)
[place] [facility] [address], [place], [address] Phone: [phone]   Fax:  [phone]  Physical Therapy Treatment  Patient Details  Name: Patricia Casey MRN: [id number] Date of Birth: 01/11/1951 Referring Provider: Dr. Sherlynn Stalls  Encounter Date: 01/17/2017      PT End of Session - 01/17/17 1543    Visit Number 5   Number of Visits 17   Date for PT Re-Evaluation 02/14/17   Authorization Type 5 / 10 G Code   PT Start Time 1500   PT Stop Time 1545   PT Time Calculation (min) 45 min   Activity Tolerance Patient tolerated treatment well   Behavior During Therapy [facility] for tasks assessed/performed      Past Medical History:  Diagnosis Date  . Allergy   . Hypertension     Past Surgical History:  Procedure Laterality Date  . HERNIATED NUCLEUS PULPOSUS  1997   Back surgery     There were no vitals filed for this visit.      Subjective Assessment - 01/17/17 1541    Subjective Patient reports no major changes since the previous visits and states she's been performing her HEP.    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 No/denies        TREATMENT: Manual Therapy: Radius-ulnar A-P mobilizations - 2 x 30sec, Distraction elbow flexion mobilizations - 2 x 30sec, ulnar-humeral A-P mobilizations - 2 x 30sec. Distraction mobilizations for elbow - 2 x 30sec; Patient positioned in supine during manual therapy; PROM extended holds in elbow flexion - 10 sec x 10   Therapeutic Exercise: Two # weight elbow flexion in supine - x10 5 sec holds at top position  Wall slides shoulder flexion with pushing into elbow flexion while pressing into the wall - x  12 Supine elbow flexion with maximal holds with 2# weight; 4 sec on/ 12 sec off for 45min to improve   Modalities Russian Stimulation  placed over motor points on the patient R UE along her biceps to improve elbow flexion and biceps strength. Patient controls 4 sec on /12 sec off for 10 min with patient in supine positioning to improve elbow flexor motor control.   Patient responds well to therapy with improved elbow flexion after performing manual therapy. Patient's elbow flexion improves to 80 degree with AAROM from PT after manual therapy.        PT Education - 01/17/17 1542    Education provided Yes   Education Details Importance of HEP performance at home   Person(s) Educated Patient   Methods Explanation;Demonstration   Comprehension Verbalized understanding;Returned demonstration             PT Long Term Goals - 12/20/16 0903      PT LONG TERM GOAL #1   Title Patient will be independent in HEP to continue benefits of therapy after discharge   Baseline Dependent with exercise performance and progression   Time 8   Period Weeks   Status New     PT LONG TERM GOAL #2   Title Patient will improve elbow flexion AROM to 100 to allow for performance of writiing and lifting activities  Baseline Flexion: 55deg   Time 8   Period Weeks   Status New     PT LONG TERM GOAL #3   Title Patient will be able to writing without difficulty using the R UE to allow for the signing of documents   Baseline unable to write   Time 8   Period Weeks   Status New     PT LONG TERM GOAL #4   Title Patient will improve QuickDASH to <20% to demonstrate significant improvement in lifitng activities and greater ability to perform chores around the house.   Baseline QuickDASH: 47%   Time 8   Period Weeks   Status New               Plan - 01/17/17 1543    Clinical Impression Statement Patient demonstrates improvement in elbow control after performing elbow flexion stimulation.  Patient continues to demonstrate decreased elbow flexion movement but demonstrates greater ability to seperate elbow flexion and shoulder flexion movements. Patient will benefit from further skilled therapy focsued on improving functional UE limitations to return to prior level of function.    Rehab Potential Fair   Clinical Impairments Affecting Rehab Potential (+) Highly motivated (-) current everyday smoker, CA history   PT Frequency 2x / week   PT Duration 8 weeks   PT Treatment/Interventions Electrical Stimulation;Iontophoresis 4mg /ml Dexamethasone;Moist Heat;Ultrasound;Parrafin;Functional mobility training;Therapeutic activities;Therapeutic exercise;Patient/family education;Neuromuscular re-education;Dry needling;Manual techniques;Passive range of motion   PT Next Visit Plan Progress elbow manual therapy and stretching   Consulted and Agree with Plan of Care Patient      Patient will benefit from skilled therapeutic intervention in order to improve the following deficits and impairments:  Decreased coordination, Decreased range of motion, Decreased endurance, Decreased strength  Visit Diagnosis: Stiffness of right elbow, not elsewhere classified  Muscle weakness (generalized)     Problem List Patient Active Problem List   Diagnosis Date Noted  . Lower extremity weakness 06/30/2016  . 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 Dermatitis, eczematoid 07/27/2015  . Abnormal liver enzymes 07/27/2015  . Fatigue 07/27/2015  . Esophagitis, reflux 07/27/2015  . H/O alcohol abuse 07/27/2015  . Hearing loss of left ear 07/27/2015  . Hypotension, postural 07/27/2015  . Fast heart beat 07/27/2015  . Compulsive tobacco user syndrome 07/27/2015  . Allergic rhinitis 03/16/2009  . Hypercholesteremia 03/30/2004  . BP (high blood pressure) 08/10/1993    Blythe Stanford, PT DPT 01/17/2017, 3:47 PM  [facility] [facility] [address] [place], [address] Phone: [phone]   Fax:  [phone]  Name: Patricia Casey MRN: [id number] Date of Birth: May 31, 1951

## 2017-01-22 ENCOUNTER — Ambulatory Visit: Payer: Medicare HMO

## 2017-01-25 ENCOUNTER — Ambulatory Visit: Payer: Medicare HMO

## 2017-01-25 DIAGNOSIS — Z5189 Encounter for other specified aftercare: Secondary | ICD-10-CM | POA: Diagnosis not present

## 2017-01-25 DIAGNOSIS — M25621 Stiffness of right elbow, not elsewhere classified: Secondary | ICD-10-CM | POA: Diagnosis not present

## 2017-01-25 DIAGNOSIS — M6281 Muscle weakness (generalized): Secondary | ICD-10-CM

## 2017-01-25 DIAGNOSIS — C499 Malignant neoplasm of connective and soft tissue, unspecified: Secondary | ICD-10-CM | POA: Diagnosis not present

## 2017-01-25 NOTE — Therapy (Signed)
[facility] [address] [place], [address] Phone: [phone]   Fax:  [phone]  Physical Therapy Treatment  Patient Details  Name: Markeeta Scalf MRN: [id number] Date of Birth: [birth year] Referring Provider: Dr. Sherlynn Stalls  Encounter Date: 01/25/2017      PT End of Session - 01/25/17 1643    Visit Number 6   Number of Visits 17   Date for PT Re-Evaluation 02/14/17   Authorization Type 6 / 10 G Code   PT Start Time 9379   PT Stop Time 1700   PT Time Calculation (min) 45 min   Activity Tolerance Patient tolerated treatment well   Behavior During Therapy [facility] for tasks assessed/performed      Past Medical History:  Diagnosis Date  . Allergy   . Hypertension     Past Surgical History:  Procedure Laterality Date  . HERNIATED NUCLEUS PULPOSUS  1997   Back surgery     There were no vitals filed for this visit.      Subjective Assessment - 01/25/17 1641    Subjective Patient reports no major changes since the previous treatment session. Patient reports she has not been performing HEP secondary to being sick with allergies over the past few days.    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 No/denies       TREATMENT: Manual Therapy: Radius-ulnar A-P mobilizations - 2 x 30sec, Distraction elbow flexion mobilizations - 2 x 30sec, ulnar-humeral A-P mobilizations - 2 x 30sec. Distraction mobilizations for elbow - 2 x 30sec; Patient positioned in supine during manual therapy; PROM extended holds in elbow flexion - 10 sec x 10   Therapeutic Exercise: Two # weight elbow flexion in supine - x10 5 sec holds at top position  Wall slides  shoulder flexion with pushing into elbow flexion while pressing into the wall - x 5  Supine elbow flexion with maximal holds with 2# weight; 4 sec on/ 12 sec off for 35min to improve Seated Scapular retraction with RTB - x10  Forearm presses into the all - x8 with 5 sec holds   Modalities Russian Stimulation placed over motor points on the patient R UE along her biceps to improve elbow flexion and biceps strength. Patient controls 4 sec on /12 sec off for 10 min with patient in supine positioning to improve elbow flexor motor control.    Patient responds well to therapy with increased elbow fatigue at end of session        PT Education - 01/25/17 1642    Education provided Yes   Education Details Importance of usinf R UE for functional tasks, importance of performing HEP   Person(s) Educated Patient   Methods Explanation;Demonstration   Comprehension Verbalized understanding;Returned demonstration             PT Long Term Goals - 12/20/16 0903      PT LONG TERM GOAL #1   Title Patient will be independent in HEP to continue benefits of therapy after discharge   Baseline Dependent with exercise performance and progression   Time 8   Period Weeks   Status New     PT  LONG TERM GOAL #2   Title Patient will improve elbow flexion AROM to 100 to allow for performance of writiing and lifting activities   Baseline Flexion: 55deg   Time 8   Period Weeks   Status New     PT LONG TERM GOAL #3   Title Patient will be able to writing without difficulty using the R UE to allow for the signing of documents   Baseline unable to write   Time 8   Period Weeks   Status New     PT LONG TERM GOAL #4   Title Patient will improve QuickDASH to <20% to demonstrate significant improvement in lifitng activities and greater ability to perform chores around the house.   Baseline QuickDASH: 47%   Time 8   Period Weeks   Status New               Plan - 01/25/17 1644    Clinical  Impression Statement Patient demonstrates limited improvement with elbow AROM s/p not performing her HEP. Patient demonstrates improvement in elbow flexion control with greater ability to seperate elbow flexion and shoulder flexion. Patient will benefit from further skilled therapy to return  to prior level of function.    Rehab Potential Fair   Clinical Impairments Affecting Rehab Potential (+) Highly motivated (-) current everyday smoker, CA history   PT Frequency 2x / week   PT Duration 8 weeks   PT Treatment/Interventions Electrical Stimulation;Iontophoresis 4mg /ml Dexamethasone;Moist Heat;Ultrasound;Parrafin;Functional mobility training;Therapeutic activities;Therapeutic exercise;Patient/family education;Neuromuscular re-education;Dry needling;Manual techniques;Passive range of motion   PT Next Visit Plan Progress elbow manual therapy and stretching   Consulted and Agree with Plan of Care Patient      Patient will benefit from skilled therapeutic intervention in order to improve the following deficits and impairments:  Decreased coordination, Decreased range of motion, Decreased endurance, Decreased strength  Visit Diagnosis: Stiffness of right elbow, not elsewhere classified  Muscle weakness (generalized)     Problem List Patient Active Problem List   Diagnosis Date Noted  . Lower extremity weakness 06/30/2016  . 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 Dermatitis, eczematoid 07/27/2015  . Abnormal liver enzymes 07/27/2015  . Fatigue 07/27/2015  . Esophagitis, reflux 07/27/2015  . H/O alcohol abuse 07/27/2015  . Hearing loss of left ear 07/27/2015  . Hypotension, postural 07/27/2015  . Fast heart beat 07/27/2015  . Compulsive tobacco user syndrome 07/27/2015  . Allergic rhinitis 03/16/2009  . Hypercholesteremia 03/30/2004  . BP (high blood pressure) 08/10/1993    Blythe Stanford, PT DPT 01/25/2017, 4:57 PM  [place] [facility] [address] [place], [address] Phone: [phone]   Fax:  [phone]  Name: Kyann Heydt MRN: [id number] Date of Birth: 1951/05/06

## 2017-01-29 ENCOUNTER — Ambulatory Visit: Payer: Medicare HMO

## 2017-01-29 DIAGNOSIS — M6281 Muscle weakness (generalized): Secondary | ICD-10-CM

## 2017-01-29 DIAGNOSIS — M25621 Stiffness of right elbow, not elsewhere classified: Secondary | ICD-10-CM | POA: Diagnosis not present

## 2017-01-29 NOTE — Therapy (Signed)
[place] [facility] [address], [place], [address] Phone: [phone]   Fax:  [phone]  Physical Therapy Treatment  Patient Details  Name: Patricia Casey MRN: [id number] Date of Birth: [birth year] Referring Provider: Dr. Sherlynn Stalls  Encounter Date: 01/29/2017      PT End of Session - 01/29/17 1544    Visit Number 7   Number of Visits 17   Date for PT Re-Evaluation 02/14/17   Authorization Type 7 / 10 G Code   PT Start Time 1696   PT Stop Time 1615   PT Time Calculation (min) 45 min   Activity Tolerance Patient tolerated treatment well   Behavior During Therapy [facility] for tasks assessed/performed      Past Medical History:  Diagnosis Date  . Allergy   . Hypertension     Past Surgical History:  Procedure Laterality Date  . HERNIATED NUCLEUS PULPOSUS  1997   Back surgery     There were no vitals filed for this visit.      Subjective Assessment - 01/29/17 1541    Subjective Patient reports no major changes but reports she's been able to use her R UE for performing chores. Patient reports no current pain.   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 No/denies       TREATMENT: Manual Therapy: Radius-ulnar A-P mobilizations - 2 x 30sec, Distraction elbow flexion mobilizations - 2 x 30sec, ulnar-humeral A-P mobilizations - 2 x 30sec. Distraction mobilizations for elbow - 2 x 30sec; Patient positioned in supine during manual therapy; PROM extended holds in elbow flexion - 10 sec x 10   Therapeutic Exercise: Three # weight elbow flexion in supine - x10 5 sec holds at top position  Wall slides shoulder flexion with pushing into elbow flexion  while pressing into the wall - x 10 Supine elbow flexion with maximal holds with 3# weight; 4 sec on/ 12 sec off for 82min to improve Push-ups at wall - x10  Ball Throws in standing - x 10    Modalities Russian Stimulation placed over motor points on the patient R UE along her biceps to improve elbow flexion and biceps strength. Patient controls 4 sec on /12 sec off for 10 min with patient in supine positioning to improve elbow flexor motor control.    Patient responds well to therapy with increased elbow fatigue at end of session       PT Education - 01/29/17 1544    Education provided Yes   Education Details form/technique with exercise   Person(s) Educated Patient   Methods Explanation;Demonstration   Comprehension Verbalized understanding;Returned demonstration             PT Long Term Goals - 12/20/16 0903      PT LONG TERM GOAL #1   Title Patient will be independent in HEP to continue benefits of therapy after discharge   Baseline Dependent with exercise performance and progression   Time 8   Period Weeks   Status New     PT LONG TERM GOAL #2   Title Patient will improve elbow flexion AROM to 100 to allow for performance of writiing and lifting  activities   Baseline Flexion: 55deg   Time 8   Period Weeks   Status New     PT LONG TERM GOAL #3   Title Patient will be able to writing without difficulty using the R UE to allow for the signing of documents   Baseline unable to write   Time 8   Period Weeks   Status New     PT LONG TERM GOAL #4   Title Patient will improve QuickDASH to <20% to demonstrate significant improvement in lifitng activities and greater ability to perform chores around the house.   Baseline QuickDASH: 47%   Time 8   Period Weeks   Status New               Plan - 01/29/17 1545    Clinical Impression Statement Patient continues to demonstrate limited improvement with elbow flexion but is able to perform elbow flexion with  greater ease compared to previous sessions. Patient demonstrates an elbow flexion angle of 77 after performing exercises and patient will benefit from further skilled therapy to return to prior level of function.    Rehab Potential Fair   Clinical Impairments Affecting Rehab Potential (+) Highly motivated (-) current everyday smoker, CA history   PT Frequency 2x / week   PT Duration 8 weeks   PT Treatment/Interventions Electrical Stimulation;Iontophoresis 4mg /ml Dexamethasone;Moist Heat;Ultrasound;Parrafin;Functional mobility training;Therapeutic activities;Therapeutic exercise;Patient/family education;Neuromuscular re-education;Dry needling;Manual techniques;Passive range of motion   PT Next Visit Plan Progress elbow manual therapy and stretching   Consulted and Agree with Plan of Care Patient      Patient will benefit from skilled therapeutic intervention in order to improve the following deficits and impairments:  Decreased coordination, Decreased range of motion, Decreased endurance, Decreased strength  Visit Diagnosis: Stiffness of right elbow, not elsewhere classified  Muscle weakness (generalized)     Problem List Patient Active Problem List   Diagnosis Date Noted  . Lower extremity weakness 06/30/2016  . 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 Dermatitis, eczematoid 07/27/2015  . Abnormal liver enzymes 07/27/2015  . Fatigue 07/27/2015  . Esophagitis, reflux 07/27/2015  . H/O alcohol abuse 07/27/2015  . Hearing loss of left ear 07/27/2015  . Hypotension, postural 07/27/2015  . Fast heart beat 07/27/2015  . Compulsive tobacco user syndrome 07/27/2015  . Allergic rhinitis 03/16/2009  . Hypercholesteremia 03/30/2004  . BP (high blood pressure) 08/10/1993    Blythe Stanford, PT DPT 01/29/2017, 4:16 PM  [facility] [address] [place],  [address] Phone: [phone]   Fax:  [phone]  Name: Patricia Casey MRN: [id number] Date of Birth: 1951/04/24

## 2017-01-31 ENCOUNTER — Ambulatory Visit: Payer: Medicare HMO

## 2017-02-05 ENCOUNTER — Ambulatory Visit: Payer: Medicare HMO | Admitting: Physical Therapy

## 2017-02-05 ENCOUNTER — Encounter: Payer: Self-pay | Admitting: Physical Therapy

## 2017-02-05 DIAGNOSIS — M6281 Muscle weakness (generalized): Secondary | ICD-10-CM | POA: Diagnosis not present

## 2017-02-05 DIAGNOSIS — M25621 Stiffness of right elbow, not elsewhere classified: Secondary | ICD-10-CM | POA: Diagnosis not present

## 2017-02-05 NOTE — Therapy (Signed)
[name] [address], [place], [address] Phone: [phone]   Fax:  [phone]  Physical Therapy Treatment  Patient Details  Name: Patricia Casey MRN: [id number] Date of Birth: 03-24-1951 Referring Provider: Dr. Sherlynn Stalls  Encounter Date: 02/05/2017      PT End of Session - 02/05/17 1530    Visit Number 8   Number of Visits 17   Date for PT Re-Evaluation 02/14/17   Authorization Type 8 / 10 G Code   PT Start Time 6063   PT Stop Time 1616   PT Time Calculation (min) 46 min   Activity Tolerance Patient tolerated treatment well   Behavior During Therapy [facility] for tasks assessed/performed      Past Medical History:  Diagnosis Date  . Allergy   . Hypertension     Past Surgical History:  Procedure Laterality Date  . HERNIATED NUCLEUS PULPOSUS  1997   Back surgery     There were no vitals filed for this visit.      Subjective Assessment - 02/05/17 1534    Subjective Pt reports she has been completing her HEP without any questions or concerns.  No new complaints or concerns.    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 No/denies   Multiple Pain Sites No       TREATMENT:    Manual Therapy:  Radius-ulnar A-P mobilizations - 2 x 30sec, Distraction elbow flexion mobilizations - 2 x 30sec, ulnar-humeral A-P mobilizations - 2 x 30sec. Distraction mobilizations for elbow - 2 x 30sec; Patient positioned in supine during manual therapy; PROM extended holds in elbow flexion - 2x20 seconds  Contract relax into R elbow flexion with 5 second holds x10.    Therapeutic Exercise:  3# weight elbow flexion in supine - x10  Wall slides  shoulder flexion with pushing into elbow flexion while pressing into the wall - x 10  Supine elbow flexion with maximal holds with 3# weight; 4 sec on/ 12 sec off for 85min to improve bicep activation  Push-ups at wall - x10    Modalities  Russian Stimulation placed over motor points on the patient R UE along her biceps to improve elbow flexion and biceps strength. Patient controls 4 sec on /12 sec off for 10 min with patient in supine positioning to improve elbow flexor motor control.             PT Education - 02/05/17 1530    Education provided Yes   Education Details Exercise technique   Person(s) Educated Patient   Methods Explanation;Demonstration   Comprehension Verbalized understanding;Returned demonstration;Need further instruction             PT Long Term Goals - 12/20/16 0903      PT LONG TERM GOAL #1   Title Patient will be independent in HEP to continue benefits of therapy after discharge   Baseline Dependent with exercise performance and progression   Time 8   Period Weeks   Status New     PT LONG TERM GOAL #2   Title Patient will improve elbow flexion AROM to 100 to allow for performance of writiing and lifting  activities   Baseline Flexion: 55deg   Time 8   Period Weeks   Status New     PT LONG TERM GOAL #3   Title Patient will be able to writing without difficulty using the R UE to allow for the signing of documents   Baseline unable to write   Time 8   Period Weeks   Status New     PT LONG TERM GOAL #4   Title Patient will improve QuickDASH to <20% to demonstrate significant improvement in lifitng activities and greater ability to perform chores around the house.   Baseline QuickDASH: 47%   Time 8   Period Weeks   Status New               Plan - 02/05/17 1605    Clinical Impression Statement Pt continues to demonstrate significant deficits in R elbow flexion and extension but responds well to manual thearpy techniques.  She  fatigues quickly with R elbow flexion exercise with 3# weight.  Pt reports fatigue at end of session following russian stim for bicep activation.  She will continue to benefit from continued skilled PT interventions for improved strength, ROM, and functional use of RUE.    Rehab Potential Fair   Clinical Impairments Affecting Rehab Potential (+) Highly motivated (-) current everyday smoker, CA history   PT Frequency 2x / week   PT Duration 8 weeks   PT Treatment/Interventions Electrical Stimulation;Iontophoresis 4mg /ml Dexamethasone;Moist Heat;Ultrasound;Parrafin;Functional mobility training;Therapeutic activities;Therapeutic exercise;Patient/family education;Neuromuscular re-education;Dry needling;Manual techniques;Passive range of motion   PT Next Visit Plan Progress elbow manual therapy and stretching   Consulted and Agree with Plan of Care Patient      Patient will benefit from skilled therapeutic intervention in order to improve the following deficits and impairments:  Decreased coordination, Decreased range of motion, Decreased endurance, Decreased strength  Visit Diagnosis: Stiffness of right elbow, not elsewhere classified  Muscle weakness (generalized)     Problem List Patient Active Problem List   Diagnosis Date Noted  . Lower extremity weakness 06/30/2016  . 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 Dermatitis, eczematoid 07/27/2015  . Abnormal liver enzymes 07/27/2015  . Fatigue 07/27/2015  . Esophagitis, reflux 07/27/2015  . H/O alcohol abuse 07/27/2015  . Hearing loss of left ear 07/27/2015  . Hypotension, postural 07/27/2015  . Fast heart beat 07/27/2015  . Compulsive tobacco user syndrome 07/27/2015  . Allergic rhinitis 03/16/2009  . Hypercholesteremia 03/30/2004  . BP (high blood pressure) 08/10/1993    Collie Siad PT, DPT 02/05/2017, 4:19 PM  [place] [facility] [address], [place], [address] Phone: [phone]   Fax:  [phone]  Name: Patricia Casey MRN: [id number] Date of Birth: [birth year]

## 2017-02-08 ENCOUNTER — Ambulatory Visit: Payer: Medicare HMO

## 2017-02-13 ENCOUNTER — Ambulatory Visit: Payer: Medicare HMO | Attending: Orthopedic Surgery

## 2017-02-13 DIAGNOSIS — M6281 Muscle weakness (generalized): Secondary | ICD-10-CM | POA: Insufficient documentation

## 2017-02-13 DIAGNOSIS — M25621 Stiffness of right elbow, not elsewhere classified: Secondary | ICD-10-CM | POA: Diagnosis not present

## 2017-02-13 NOTE — Therapy (Signed)
[facility] [address] [place], [address] Phone: [phone]   Fax:  [phone]  Physical Therapy Treatment  Patient Details  Name: Patricia Casey MRN: [id number] Date of Birth: 14-May-1951 Referring Provider: Dr. Sherlynn Stalls  Encounter Date: 02/13/2017      PT End of Session - 02/13/17 1424    Visit Number 9   Number of Visits 17   Date for PT Re-Evaluation 02/14/17   Authorization Type 9 / 10 G Code   PT Start Time 1400   PT Stop Time 1445   PT Time Calculation (min) 45 min   Activity Tolerance Patient tolerated treatment well   Behavior During Therapy [facility] for tasks assessed/performed      Past Medical History:  Diagnosis Date  . Allergy   . Hypertension     Past Surgical History:  Procedure Laterality Date  . HERNIATED NUCLEUS PULPOSUS  1997   Back surgery     There were no vitals filed for this visit.      Subjective Assessment - 02/13/17 1408    Subjective Patient reports she has been performing her HEP and been using her arm more to perform ADLs.   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 No/denies      TREATMENT: Manual Therapy: Radius-ulnar A-P mobilizations - 2 x 30sec, Distraction elbow flexion mobilizations - 2 x 30sec, ulnar-humeral A-P mobilizations - 2 x 30sec. Distraction mobilizations for elbow - 2 x 30sec; Patient positioned in supine during manual therapy; PROM extended holds in elbow flexion - 10 sec x 10   Therapeutic Exercise: Three # weight elbow flexion in supine - x10 5 sec holds at top position  Wall slides shoulder flexion with pushing into elbow flexion while pressing into the wall - x 10 Supine  elbow flexion with maximal holds with 3# weight; 4 sec on/ 12 sec off for 67min to improve Ball throws in standing from chest- x 10  Underhand throws with 4# ball x10; 2# ball x10     Modalities Russian Stimulation placed over motor points on the patient R UE along her biceps to improve elbow flexion and biceps strength. Patient controls 4 sec on /12 sec off for 12 min with patient in supine positioning to improve elbow flexor motor control.    Patient responds well to therapy with increased elbow fatigue at end of session.       PT Education - 02/13/17 1415    Education provided Yes   Education Details Form/technique with exercise   Person(s) Educated Patient   Methods Explanation;Demonstration   Comprehension Verbalized understanding;Returned demonstration             PT Long Term Goals - 12/20/16 0903      PT LONG TERM GOAL #1   Title Patient will be independent in HEP to continue benefits of therapy after discharge   Baseline Dependent with exercise performance and progression   Time 8   Period Weeks   Status New     PT LONG TERM GOAL #2   Title Patient will improve elbow flexion AROM to 100 to allow for performance of writiing and lifting activities  Baseline Flexion: 55deg   Time 8   Period Weeks   Status New     PT LONG TERM GOAL #3   Title Patient will be able to writing without difficulty using the R UE to allow for the signing of documents   Baseline unable to write   Time 8   Period Weeks   Status New     PT LONG TERM GOAL #4   Title Patient will improve QuickDASH to <20% to demonstrate significant improvement in lifitng activities and greater ability to perform chores around the house.   Baseline QuickDASH: 47%   Time 8   Period Weeks   Status New               Plan - 02/13/17 1435    Clinical Impression Statement Patient continues to demonstrate significant deficits in AROM but demonstrates improvement in strength compared to the  previous visit. Patient continues to demonstrate difficulty with UE movements demonstrating poor coordination. Patient will benefit from further skilled therapy to return to prior level of function.    Rehab Potential Fair   Clinical Impairments Affecting Rehab Potential (+) Highly motivated (-) current everyday smoker, CA history   PT Frequency 2x / week   PT Duration 8 weeks   PT Treatment/Interventions Electrical Stimulation;Iontophoresis 4mg /ml Dexamethasone;Moist Heat;Ultrasound;Parrafin;Functional mobility training;Therapeutic activities;Therapeutic exercise;Patient/family education;Neuromuscular re-education;Dry needling;Manual techniques;Passive range of motion   PT Next Visit Plan Progress elbow manual therapy and stretching   Consulted and Agree with Plan of Care Patient      Patient will benefit from skilled therapeutic intervention in order to improve the following deficits and impairments:  Decreased coordination, Decreased range of motion, Decreased endurance, Decreased strength  Visit Diagnosis: Stiffness of right elbow, not elsewhere classified  Muscle weakness (generalized)     Problem List Patient Active Problem List   Diagnosis Date Noted  . Lower extremity weakness 06/30/2016  . 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 Dermatitis, eczematoid 07/27/2015  . Abnormal liver enzymes 07/27/2015  . Fatigue 07/27/2015  . Esophagitis, reflux 07/27/2015  . H/O alcohol abuse 07/27/2015  . Hearing loss of left ear 07/27/2015  . Hypotension, postural 07/27/2015  . Fast heart beat 07/27/2015  . Compulsive tobacco user syndrome 07/27/2015  . Allergic rhinitis 03/16/2009  . Hypercholesteremia 03/30/2004  . BP (high blood pressure) 08/10/1993    Blythe Stanford, PT DPT 02/13/2017, 2:46 PM  [facility] [address] [place], [address] Phone:  [phone]   Fax:  [phone]  Name: Patricia Casey MRN: [id number] Date of Birth: 1951/04/10

## 2017-02-15 ENCOUNTER — Ambulatory Visit: Payer: Medicare HMO

## 2017-02-20 ENCOUNTER — Ambulatory Visit: Payer: Medicare HMO

## 2017-02-20 DIAGNOSIS — M6281 Muscle weakness (generalized): Secondary | ICD-10-CM

## 2017-02-20 DIAGNOSIS — M25621 Stiffness of right elbow, not elsewhere classified: Secondary | ICD-10-CM | POA: Diagnosis not present

## 2017-02-21 NOTE — Therapy (Signed)
[place] [facility] [address] [place], [address] Phone: [phone]   Fax:  [phone]  Physical Therapy Treatment  Patient Details  Name: Patricia Casey MRN: [id number] Date of Birth: Mar 14, 1951 Referring Provider: Dr. Sherlynn Stalls  Encounter Date: 02/20/2017      PT End of Session - 02/21/17 1025    Visit Number 10   Number of Visits 17   Date for PT Re-Evaluation 04/04/17   Authorization Type 10 / 10 G Code   PT Start Time 1300   PT Stop Time 1400   PT Time Calculation (min) 60 min   Activity Tolerance Patient tolerated treatment well   Behavior During Therapy [facility] for tasks assessed/performed      Past Medical History:  Diagnosis Date  . Allergy   . Hypertension     Past Surgical History:  Procedure Laterality Date  . HERNIATED NUCLEUS PULPOSUS  1997   Back surgery     There were no vitals filed for this visit.      Subjective Assessment - 02/20/17 1318    Subjective Patient reports she's been performing the exercise and is interested in receiving a long term brace to improve elbow flexion. Patient reports she is able to write easier compared to previous treatments.    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 No/denies      TREATMENT: Therapeutic exercise: Pushing off chair with R UE to improve elbow extension - x10 5sec Standing elbow flexion with RTB - x20  Standing elbow extension with RTB - x20 with RTB anchored on OMEGA  Standing elbow extension with 5# resistance at [place] - x8 stopped due to decrease in AROM available       PT Education - 02/20/17 1321    Education provided Yes   Education Details form/technique  with exercise   Person(s) Educated Patient   Methods Explanation;Demonstration   Comprehension Verbalized understanding;Returned demonstration             PT Long Term Goals - 02/21/17 1026      PT LONG TERM GOAL #1   Title Patient will be independent in HEP to continue benefits of therapy after discharge   Baseline Dependent with exercise performance and progression; 02/20/2017: requires frequent cueing on    Time 8   Period Weeks   Status On-going     PT LONG TERM GOAL #2   Title Patient will improve elbow flexion AROM to 100 to allow for performance of writiing and lifting activities   Baseline Flexion: 55deg; 02/20/2017 flexion: 75deg   Time 8   Period Weeks   Status On-going     PT LONG TERM GOAL #3   Title Patient will be able to writing without difficulty using the R UE to allow for the signing of documents   Baseline unable to write; 02/21/2017: Patient able to write without difficulty   Time 8   Period Weeks   Status Achieved     PT LONG TERM GOAL #4   Title Patient will improve QuickDASH to <20% to demonstrate significant improvement in lifitng activities and greater ability to perform chores around the house.   Baseline  QuickDASH: 47%; 02/20/2017: QuickDASH: Deffered to next visit   Time 8   Period Weeks   Status On-going               Plan - 02/28/2017 1048    Clinical Impression Statement Patient demonstrates improvement in elbow flexion action and ability to write compared to the intial visit. Patient also demonstrates improvement with performing exercises requiring less cueing to perform with appropriate technique. Patient continues to demonstrate decreased elbow flexion and talked about using a brace to improve lack of motion. Will send in paperwork to rep for further instruction. Patient experienced an medical emergency at the end of the session with the following progression.  Patient was performing standing bicep curls at the OMEGA resistance machine  2. Patient stops exercise and her knee's/LE's begin to quiver as she slowly lowers 3. I help her stand to stop her from falling and assist her onto a near stool. 4. I ask patient questions for 2 min and she is non-responsive and patient begins to drool uncontrollably. 5. I notify fellow co worker to contact 911 and another physical therapist helps me bring the patient onto the floor and have her lye on her side. 6. Take vitals, Patient's BP: 122/80, HR: 114bpm; coworker is still online with EMS and she relays information to them. 7. Continue to talk to patient who is alert at this point but is slow to respond to verbal cueing but is AOx4. 8. Retake BP: 106/66, EMS arrives: relay information to them and attend while they are assessing patient. 9. While EMS is assessing, I called the Physician and was told he would call back with any questions. 10. Patient refuses transport to the hospital and stands up with help from EMS and Therapy staff. 11. I walk patient with her husband Charlotte Crumb back to the car, she enters safely. Called the referring MD to update and educated patient to seek medical attention.     Rehab Potential Fair   Clinical Impairments Affecting Rehab Potential (+) Highly motivated (-) current everyday smoker, CA history   PT Frequency 2x / week   PT Duration 8 weeks   PT Treatment/Interventions Electrical Stimulation;Iontophoresis 4mg /ml Dexamethasone;Moist Heat;Ultrasound;Parrafin;Functional mobility training;Therapeutic activities;Therapeutic exercise;Patient/family education;Neuromuscular re-education;Dry needling;Manual techniques;Passive range of motion   PT Next Visit Plan Progress elbow manual therapy and stretching   Consulted and Agree with Plan of Care Patient      Patient will benefit from skilled therapeutic intervention in order to improve the following deficits and impairments:  Decreased coordination, Decreased range of motion, Decreased endurance, Decreased strength  Visit  Diagnosis: Stiffness of right elbow, not elsewhere classified  Muscle weakness (generalized)       G-Codes - 2017-02-28 1056    Functional Assessment Tool Used (Outpatient Only) QuickDash, MMT, AROM, clinical judgment   Functional Limitation Carrying, moving and handling objects   Carrying, Moving and Handling Objects Current Status (W9798) At least 20 percent but less than 40 percent impaired, limited or restricted   Carrying, Moving and Handling Objects Goal Status (X2119) At least 1 percent but less than 20 percent impaired, limited or restricted      Problem List Patient Active Problem List   Diagnosis Date Noted  . Lower extremity weakness 06/30/2016  . 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 Dermatitis, eczematoid 07/27/2015  . Abnormal liver enzymes 07/27/2015  . Fatigue 07/27/2015  . Esophagitis, reflux 07/27/2015  . H/O alcohol abuse 07/27/2015  .  Hearing loss of left ear 07/27/2015  . Hypotension, postural 07/27/2015  . Fast heart beat 07/27/2015  . Compulsive tobacco user syndrome 07/27/2015  . Allergic rhinitis 03/16/2009  . Hypercholesteremia 03/30/2004  . BP (high blood pressure) 08/10/1993    Blythe Stanford, PT DPT 02/21/2017, 10:57 AM  [place] [facility] [address], [place], [address] Phone: [phone]   Fax:  [phone]  Name: Leelah Hanna MRN: [id number] Date of Birth: Apr 06, 1951

## 2017-02-21 NOTE — Addendum Note (Signed)
Addended by: Blain Pais on: 02/21/2017 11:01 AM   Modules accepted: Orders

## 2017-02-22 ENCOUNTER — Ambulatory Visit: Payer: Medicare HMO

## 2017-02-25 DIAGNOSIS — Z5189 Encounter for other specified aftercare: Secondary | ICD-10-CM | POA: Diagnosis not present

## 2017-02-25 DIAGNOSIS — C499 Malignant neoplasm of connective and soft tissue, unspecified: Secondary | ICD-10-CM | POA: Diagnosis not present

## 2017-02-25 DIAGNOSIS — M6281 Muscle weakness (generalized): Secondary | ICD-10-CM | POA: Diagnosis not present

## 2017-02-27 ENCOUNTER — Ambulatory Visit: Payer: Medicare HMO

## 2017-02-27 DIAGNOSIS — M25621 Stiffness of right elbow, not elsewhere classified: Secondary | ICD-10-CM | POA: Diagnosis not present

## 2017-02-27 DIAGNOSIS — M6281 Muscle weakness (generalized): Secondary | ICD-10-CM | POA: Diagnosis not present

## 2017-02-27 NOTE — Therapy (Signed)
[facility] [address], [place], [address] Phone: [phone]   Fax:  [phone]  Physical Therapy Treatment  Patient Details  Name: Patricia Casey MRN: [id number] Date of Birth: Nov 15, 1950 Referring Provider: Dr. Sherlynn Stalls  Encounter Date: 02/27/2017      PT End of Session - 02/27/17 1421    Visit Number 11   Number of Visits 17   Date for PT Re-Evaluation 04/04/17   Authorization Type 1 / 10 G Code   PT Start Time 1400   PT Stop Time 1445   PT Time Calculation (min) 45 min   Activity Tolerance Patient tolerated treatment well   Behavior During Therapy [facility] for tasks assessed/performed      Past Medical History:  Diagnosis Date  . Allergy   . Hypertension     Past Surgical History:  Procedure Laterality Date  . HERNIATED NUCLEUS PULPOSUS  1997   Back surgery     There were no vitals filed for this visit.      Subjective Assessment - 02/27/17 1407    Subjective Patient reports she ate something before the treatment session today. Patient reports she's been feeling good today.   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 No/denies      TREATMENT:  Therapeutic Exercise:  MET -- x 10 with holding patients elbow into max flexion, preceeded by max extension with 5 sec holds in the opposite direction of the motion trying to improve Supine elbow flexion with 3# weight -- x 20 Supine supination/pronation with 3# -- x 20 with 3 sec holds at each repetition Seated wrist maze back and forth -- x 3 to improve elbow strength and coordination Seated scapular retraction with elbow flexion to improve shoulder/elbow strength with RTB -- x  10 with 6 sec holds  Patient demonstrates increased elbow fatigue at end of the session           PT Education - 02/27/17 1420    Education provided Yes   Education Details form/technique with exercise   Person(s) Educated Patient   Methods Explanation;Demonstration   Comprehension Verbalized understanding;Returned demonstration             PT Long Term Goals - 02/21/17 1026      PT LONG TERM GOAL #1   Title Patient will be independent in HEP to continue benefits of therapy after discharge   Baseline Dependent with exercise performance and progression; 02/20/2017: requires frequent cueing on    Time 8   Period Weeks   Status On-going     PT LONG TERM GOAL #2   Title Patient will improve elbow flexion AROM to 100 to allow for performance of writiing and lifting activities   Baseline Flexion: 55deg; 02/20/2017 flexion: 75deg   Time 8   Period Weeks   Status On-going     PT LONG TERM GOAL #3   Title Patient will be able to writing without difficulty using the R UE to allow for the signing of documents   Baseline unable to write; 02/21/2017: Patient able to write without difficulty   Time 8   Period Weeks   Status Achieved  PT LONG TERM GOAL #4   Title Patient will improve QuickDASH to <20% to demonstrate significant improvement in lifitng activities and greater ability to perform chores around the house.   Baseline QuickDASH: 47%; 02/20/2017: QuickDASH: Deffered to next visit   Time 8   Period Weeks   Status On-going               Plan - 02/27/17 1422    Clinical Impression Statement Continued to focus on improving elbow AROM and strengthening within the available AROM to improve elbow function. Performeed MET to the elbow resulting in improvement in elbow AROM followed by exercises to improve strength in the available AROM. Educated patient on use of elbow brace and patient will benefit from further skilled therapy return to prior level of function.     Rehab Potential Fair   Clinical Impairments Affecting Rehab Potential (+) Highly motivated (-) current everyday smoker, CA history   PT Frequency 2x / week   PT Duration 8 weeks   PT Treatment/Interventions Electrical Stimulation;Iontophoresis 67m/ml Dexamethasone;Moist Heat;Ultrasound;Parrafin;Functional mobility training;Therapeutic activities;Therapeutic exercise;Patient/family education;Neuromuscular re-education;Dry needling;Manual techniques;Passive range of motion   PT Next Visit Plan Progress elbow manual therapy and stretching   Consulted and Agree with Plan of Care Patient      Patient will benefit from skilled therapeutic intervention in order to improve the following deficits and impairments:  Decreased coordination, Decreased range of motion, Decreased endurance, Decreased strength  Visit Diagnosis: Stiffness of right elbow, not elsewhere classified  Muscle weakness (generalized)     Problem List Patient Active Problem List   Diagnosis Date Noted  . Lower extremity weakness 06/30/2016  . 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 Malignant [place] 12/20/2015  . Dermatitis, eczematoid 07/27/2015  . Abnormal liver enzymes 07/27/2015  . Fatigue 07/27/2015  . Esophagitis, reflux 07/27/2015  . H/O alcohol abuse 07/27/2015  . Hearing loss of left ear 07/27/2015  . Hypotension, postural 07/27/2015  . Fast heart beat 07/27/2015  . Compulsive tobacco user syndrome 07/27/2015  . Allergic rhinitis 03/16/2009  . Hypercholesteremia 03/30/2004  . BP (high blood pressure) 08/10/1993    WBlythe Stanford PT DPT 02/27/2017, 2:39 PM  [facility] [address] [place] [address]Phone: [phone]  Fax:  [phone] Name: Patricia KrasinskiMRN: [id number] of Birth: [birth year]

## 2017-03-01 ENCOUNTER — Ambulatory Visit: Payer: Medicare HMO

## 2017-03-01 DIAGNOSIS — M25621 Stiffness of right elbow, not elsewhere classified: Secondary | ICD-10-CM | POA: Diagnosis not present

## 2017-03-01 DIAGNOSIS — M6281 Muscle weakness (generalized): Secondary | ICD-10-CM

## 2017-03-01 NOTE — Therapy (Signed)
[facility] [address], [place], [address] Phone: [phone]   Fax:  [phone]  Physical Therapy Treatment  Patient Details  Name: Patricia Casey MRN: [id number] Date of Birth: September 11, 1950 Referring Provider: Dr. Sherlynn Stalls  Encounter Date: 03/01/2017      PT End of Session - 03/01/17 1458    Visit Number 12   Number of Visits 17   Date for PT Re-Evaluation 04/04/17   Authorization Type 2 / 10 G Code   PT Start Time 1450   PT Stop Time 1530   PT Time Calculation (min) 40 min   Activity Tolerance Patient tolerated treatment well   Behavior During Therapy [facility] for tasks assessed/performed      Past Medical History:  Diagnosis Date  . Allergy   . Hypertension     Past Surgical History:  Procedure Laterality Date  . HERNIATED NUCLEUS PULPOSUS  1997   Back surgery     There were no vitals filed for this visit.      Subjective Assessment - 03/01/17 1455    Subjective Patient reports she ate a meal today and reports she performed her exercises yesterday and "wore herself out".    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 No/denies      TREATMENT:  Therapeutic Exercise:   MET -- x 10 with holding patients elbow into max flexion, preceeded by max extension with 5 sec holds in the opposite direction of the motion trying to improve Supine elbow flexion with 4# weight -- x 20 Supine supination/pronation with 4# -- x 20 with 3 sec holds at each repetition Supine overhead tricep extensions in supine with 4# -- x 10 Chest press with GTB behind her back - x 20  Overhead chest press with red physioball - x10 Overhead Tricep extensions  in supine with red physioball - x 10    Patient demonstrates increased elbow fatigue at end of the session       PT Education - 03/01/17 1457    Education provided Yes   Education Details form/technique with exercise   Person(s) Educated Patient   Methods Explanation;Demonstration   Comprehension Verbalized understanding;Returned demonstration             PT Long Term Goals - 02/21/17 1026      PT LONG TERM GOAL #1   Title Patient will be independent in HEP to continue benefits of therapy after discharge   Baseline Dependent with exercise performance and progression; 02/20/2017: requires frequent cueing on    Time 8   Period Weeks   Status On-going     PT LONG TERM GOAL #2   Title Patient will improve elbow flexion AROM to 100 to allow for performance of writiing and lifting activities   Baseline Flexion: 55deg; 02/20/2017 flexion: 75deg   Time 8   Period Weeks   Status On-going     PT LONG TERM GOAL #3   Title Patient will be able to writing without difficulty using the R UE to allow for the signing of documents   Baseline unable to write; 02/21/2017: Patient able to write without difficulty   Time 8  Period Weeks   Status Achieved     PT LONG TERM GOAL #4   Title Patient will improve QuickDASH to <20% to demonstrate significant improvement in lifitng activities and greater ability to perform chores around the house.   Baseline QuickDASH: 47%; 02/20/2017: QuickDASH: Deffered to next visit   Time 8   Period Weeks   Status On-going               Plan - 03/01/17 1501    Clinical Impression Statement Patient demonstrates significant improvement in elbow flexion and coordination with exercises indicating functional carryover between sessions. Patient demonstrates a 85deg of elbow flexion angle at end of session indicating funcitonal carryover. Patient required increased rest breaks throughout session and will benefit from further skilled therapy to return to  prior level of function.     Rehab Potential Fair   Clinical Impairments Affecting Rehab Potential (+) Highly motivated (-) current everyday smoker, CA history   PT Frequency 2x / week   PT Duration 8 weeks   PT Treatment/Interventions Electrical Stimulation;Iontophoresis 42m/ml Dexamethasone;Moist Heat;Ultrasound;Parrafin;Functional mobility training;Therapeutic activities;Therapeutic exercise;Patient/family education;Neuromuscular re-education;Dry needling;Manual techniques;Passive range of motion   PT Next Visit Plan Progress elbow manual therapy and stretching   Consulted and Agree with Plan of Care Patient      Patient will benefit from skilled therapeutic intervention in order to improve the following deficits and impairments:  Decreased coordination, Decreased range of motion, Decreased endurance, Decreased strength  Visit Diagnosis: Stiffness of right elbow, not elsewhere classified  Muscle weakness (generalized)     Problem List Patient Active Problem List   Diagnosis Date Noted  . Lower extremity weakness 06/30/2016  . 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 Dermatitis, eczematoid 07/27/2015  . Abnormal liver enzymes 07/27/2015  . Fatigue 07/27/2015  . Esophagitis, reflux 07/27/2015  . H/O alcohol abuse 07/27/2015  . Hearing loss of left ear 07/27/2015  . Hypotension, postural 07/27/2015  . Fast heart beat 07/27/2015  . Compulsive tobacco user syndrome 07/27/2015  . Allergic rhinitis 03/16/2009  . Hypercholesteremia 03/30/2004  . BP (high blood pressure) 08/10/1993    WBlythe Stanford PT DPT 03/01/2017, 3:23 PM  [facility] [address] [place] [address]Phone: [phone]  Fax:  [phone] Name: LLoriel DiehlMRN: [id number] of Birth: 1Apr 02, 1952

## 2017-03-06 DIAGNOSIS — C499 Malignant neoplasm of connective and soft tissue, unspecified: Secondary | ICD-10-CM | POA: Diagnosis not present

## 2017-03-06 DIAGNOSIS — Z9889 Other specified postprocedural states: Secondary | ICD-10-CM | POA: Diagnosis not present

## 2017-03-14 ENCOUNTER — Ambulatory Visit: Payer: Medicare HMO

## 2017-03-19 ENCOUNTER — Ambulatory Visit: Payer: Medicare HMO

## 2017-03-21 ENCOUNTER — Ambulatory Visit: Payer: Medicare HMO

## 2017-03-26 ENCOUNTER — Ambulatory Visit: Payer: Medicare HMO

## 2017-03-28 ENCOUNTER — Ambulatory Visit: Payer: Medicare HMO

## 2017-03-28 DIAGNOSIS — Z5189 Encounter for other specified aftercare: Secondary | ICD-10-CM | POA: Diagnosis not present

## 2017-03-28 DIAGNOSIS — C499 Malignant neoplasm of connective and soft tissue, unspecified: Secondary | ICD-10-CM | POA: Diagnosis not present

## 2017-03-28 DIAGNOSIS — M6281 Muscle weakness (generalized): Secondary | ICD-10-CM | POA: Diagnosis not present

## 2017-03-30 DIAGNOSIS — M6281 Muscle weakness (generalized): Secondary | ICD-10-CM | POA: Diagnosis not present

## 2017-03-30 DIAGNOSIS — M25621 Stiffness of right elbow, not elsewhere classified: Secondary | ICD-10-CM | POA: Diagnosis not present

## 2017-04-02 ENCOUNTER — Ambulatory Visit: Payer: Medicare HMO

## 2017-04-04 ENCOUNTER — Ambulatory Visit: Payer: Medicare HMO

## 2017-04-09 ENCOUNTER — Ambulatory Visit: Payer: Medicare HMO

## 2017-04-11 ENCOUNTER — Ambulatory Visit: Payer: Medicare HMO

## 2017-04-16 ENCOUNTER — Ambulatory Visit: Payer: Medicare HMO

## 2017-04-18 ENCOUNTER — Ambulatory Visit: Payer: Medicare HMO

## 2017-04-23 ENCOUNTER — Ambulatory Visit: Payer: Medicare HMO

## 2017-04-24 ENCOUNTER — Other Ambulatory Visit: Payer: Self-pay | Admitting: Physician Assistant

## 2017-04-24 DIAGNOSIS — I10 Essential (primary) hypertension: Secondary | ICD-10-CM

## 2017-04-25 NOTE — Telephone Encounter (Signed)
Scheduled for Monday 04-30-17.

## 2017-04-25 NOTE — Telephone Encounter (Signed)
Patient listed as Caryn Section Patient but not seen since 07/2015 with Dr. Venia Minks. Not seen by anyone else.

## 2017-04-27 DIAGNOSIS — C499 Malignant neoplasm of connective and soft tissue, unspecified: Secondary | ICD-10-CM | POA: Diagnosis not present

## 2017-04-27 DIAGNOSIS — Z5189 Encounter for other specified aftercare: Secondary | ICD-10-CM | POA: Diagnosis not present

## 2017-04-27 DIAGNOSIS — M6281 Muscle weakness (generalized): Secondary | ICD-10-CM | POA: Diagnosis not present

## 2017-04-30 ENCOUNTER — Ambulatory Visit: Payer: Self-pay | Admitting: Physician Assistant

## 2017-05-01 ENCOUNTER — Ambulatory Visit (INDEPENDENT_AMBULATORY_CARE_PROVIDER_SITE_OTHER): Payer: Medicare HMO | Admitting: Family Medicine

## 2017-05-01 ENCOUNTER — Encounter: Payer: Self-pay | Admitting: Family Medicine

## 2017-05-01 VITALS — BP 130/80 | HR 92 | Temp 97.5°F | Resp 16 | Wt 189.0 lb

## 2017-05-01 DIAGNOSIS — I10 Essential (primary) hypertension: Secondary | ICD-10-CM | POA: Diagnosis not present

## 2017-05-01 DIAGNOSIS — C4499 Other specified malignant neoplasm of skin, unspecified: Secondary | ICD-10-CM

## 2017-05-01 DIAGNOSIS — Z23 Encounter for immunization: Secondary | ICD-10-CM

## 2017-05-01 MED ORDER — METOPROLOL SUCCINATE ER 25 MG PO TB24: 25.0000 mg | ORAL_TABLET | Freq: Every day | ORAL | 3 refills | Status: DC

## 2017-05-01 NOTE — Progress Notes (Signed)
Weaknes    Patient: Patricia Casey Female    DOB: [birth year]   65 y.o.   MRN: [id number] Visit Date: 05/01/2017  Today's Provider: Lelon Huh, MD   Chief Complaint  Patient presents with  . Follow-up  . Hypertension   Subjective:    HPI   Hypertension, follow-up:  BP Readings from Last 3 Encounters:  05/01/17 130/80  06/30/16 (!) 146/86  05/21/16 (!) 145/93    She was last seen for hypertension 10 months ago.  BP at that visit was 146/86. Management since that visit includes; no changes.She reports good compliance with treatment. She is not having side effects. none She is not exercising. She is adherent to low salt diet.   Outside blood pressures are 120/60. She is experiencing none.  Patient denies none.   Cardiovascular risk factors include advanced age (older than 56 for men, 78 for women).  Use of agents associated with hypertension: none.  --------------------------------------------------------------------------Weakness of both lower extremities From 06/30/2016-due to radiation treatments. Is slowly improving. Continue physical therapy.       No Known Allergies   Current Outpatient Prescriptions:  .  aspirin 81 MG tablet, Take 1 tablet by mouth daily., Disp: , Rfl:  .  fluticasone (FLONASE) 50 MCG/ACT nasal spray, Place 2 sprays into the nose daily. , Disp: , Rfl:  .  loratadine (CLARITIN) 10 MG tablet, Take 1 tablet by mouth daily as needed. , Disp: , Rfl:  .  metoprolol succinate (TOPROL-XL) 25 MG 24 hr tablet, TAKE 1 TABLET EVERY DAY, Disp: 90 tablet, Rfl: 0  Review of Systems  Constitutional: Negative for appetite change, chills, fatigue and fever.  Respiratory: Negative for chest tightness and shortness of breath.   Cardiovascular: Negative for chest pain and palpitations.  Gastrointestinal: Negative for abdominal pain, nausea and vomiting.  Neurological: Negative for dizziness and weakness.    Social History  Substance Use  Topics  . Smoking status: Current Every Day Smoker    Packs/day: 1.50    Years: 50.00    Types: Cigarettes  . Smokeless tobacco: Never Used  . Alcohol use 12.6 oz/week    21 Cans of beer per week   Objective:   BP 130/80 (BP Location: Left Arm, Patient Position: Sitting, Cuff Size: Normal)   Pulse 92   Temp (!) 97.5 F (36.4 C) (Oral)   Resp 16   Wt 189 lb (85.7 kg)   SpO2 95%   BMI 28.74 kg/m  Vitals:   05/01/17 1612  BP: 130/80  Pulse: 92  Resp: 16  Temp: (!) 97.5 F (36.4 C)  TempSrc: Oral  SpO2: 95%  Weight: 189 lb (85.7 kg)     Physical Exam   General Appearance:    Alert, cooperative, no distress  Eyes:    PERRL, conjunctiva/corneas clear, EOM's intact       Lungs:     Clear to auscultation bilaterally, respirations unlabored  Heart:    Regular rate and rhythm  Neurologic:   Awake, alert, oriented x 3. No apparent focal neurological           defect.           Assessment & Plan:     1. Essential hypertension  - metoprolol succinate (TOPROL-XL) 25 MG 24 hr tablet; Take 1 tablet (25 mg total) by mouth daily.  Dispense: 90 tablet; Refill: 3  2. Need for pneumococcal vaccination  - Pneumococcal conjugate vaccine 13-valent IM  3. Myxofibrosarcoma of  skin Continue routine follow up at [facility].        Lelon Huh, MD  [facility]

## 2017-07-18 ENCOUNTER — Telehealth: Payer: Self-pay | Admitting: Family Medicine

## 2017-07-18 DIAGNOSIS — R609 Edema, unspecified: Secondary | ICD-10-CM | POA: Diagnosis not present

## 2017-07-18 DIAGNOSIS — M25411 Effusion, right shoulder: Secondary | ICD-10-CM | POA: Diagnosis not present

## 2017-07-18 DIAGNOSIS — C4911 Malignant neoplasm of connective and soft tissue of right upper limb, including shoulder: Secondary | ICD-10-CM | POA: Diagnosis not present

## 2017-07-18 DIAGNOSIS — C499 Malignant neoplasm of connective and soft tissue, unspecified: Secondary | ICD-10-CM | POA: Diagnosis not present

## 2017-07-18 DIAGNOSIS — R918 Other nonspecific abnormal finding of lung field: Secondary | ICD-10-CM | POA: Diagnosis not present

## 2017-07-30 ENCOUNTER — Ambulatory Visit: Payer: Self-pay | Admitting: Physician Assistant

## 2017-10-11 NOTE — Telephone Encounter (Signed)
complete

## 2018-01-16 DIAGNOSIS — M7989 Other specified soft tissue disorders: Secondary | ICD-10-CM | POA: Diagnosis not present

## 2018-01-16 DIAGNOSIS — R918 Other nonspecific abnormal finding of lung field: Secondary | ICD-10-CM | POA: Diagnosis not present

## 2018-01-16 DIAGNOSIS — Z9889 Other specified postprocedural states: Secondary | ICD-10-CM | POA: Diagnosis not present

## 2018-01-16 DIAGNOSIS — C499 Malignant neoplasm of connective and soft tissue, unspecified: Secondary | ICD-10-CM | POA: Diagnosis not present

## 2018-02-10 IMAGING — CR DG CHEST 2V
1 series · 2 of 2 positions shown · non-contrast
Comparison: None.

CLINICAL DATA: Pain after fall

EXAM:
CHEST  2 VIEW

[Series 1: dg chest 2 view · 0.14mm/px · 2 of 2 slices shown]
[im 1/2]
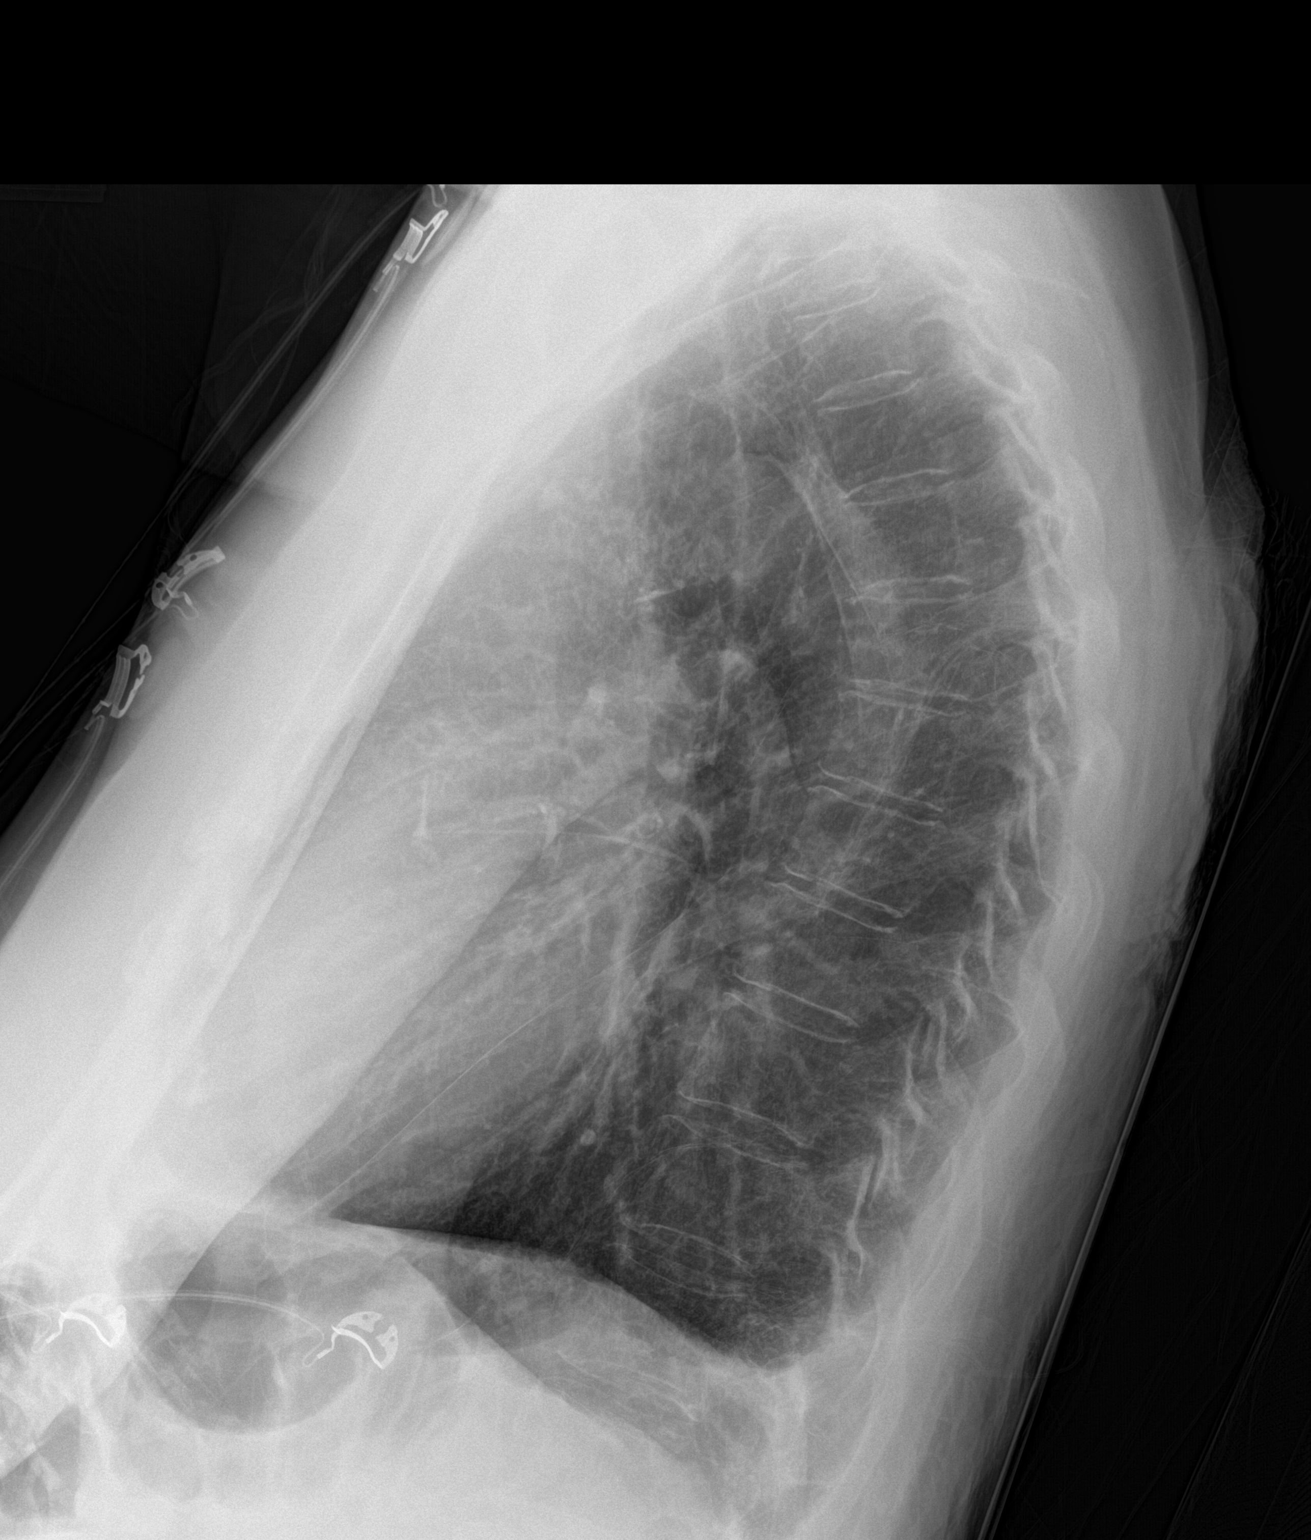
[im 2/2]
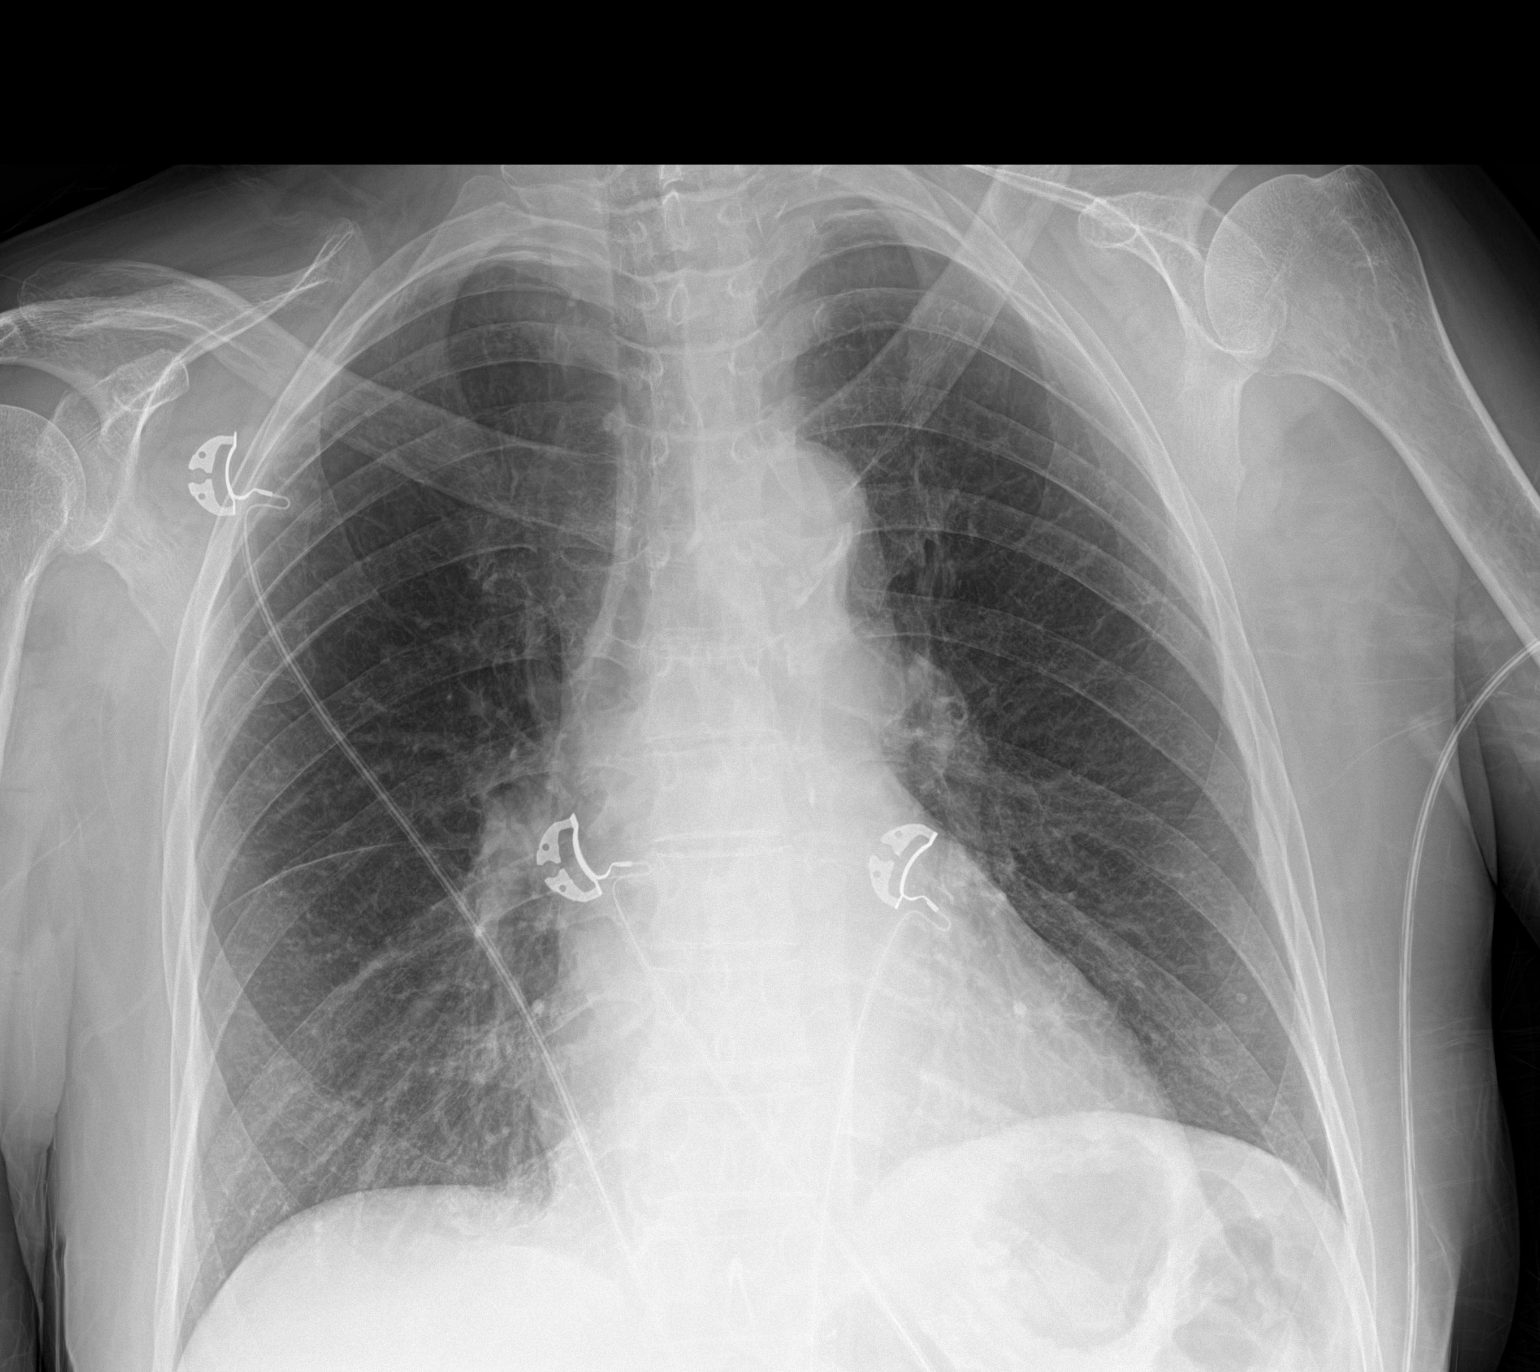

[2 of 2 positions shown; findings below may reference images not displayed]

FINDINGS: A small pleural effusion is seen on the lateral view, likely on the
left. No pneumothorax. Mild cardiomegaly. The hila and mediastinum
are unremarkable. No nodules or masses. No focal infiltrates.
IMPRESSION: Small left pleural effusion.  No other abnormalities.

## 2018-02-10 IMAGING — CR DG SHOULDER 2+V*L*
1 series · 2 of 2 positions shown · non-contrast
Comparison: None.

CLINICAL DATA: Pain after fall.

EXAM:
LEFT SHOULDER - 2+ VIEW

[Series 1: dg shoulder left · 0.14mm/px · 2 of 2 slices shown]
[im 1/2]
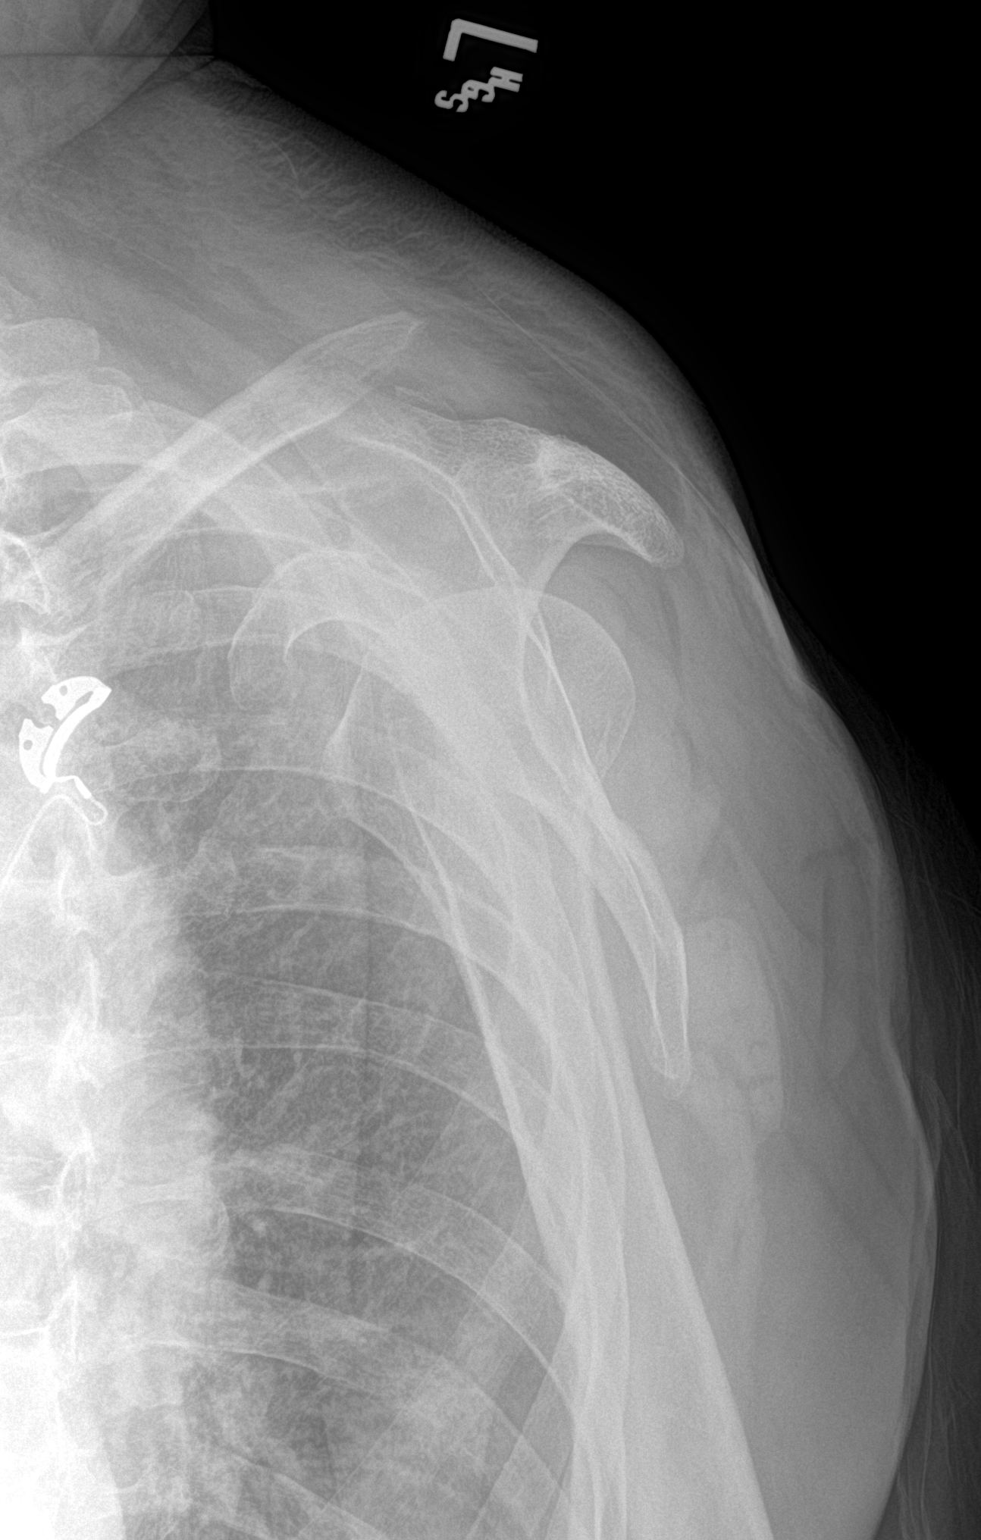
[im 2/2]
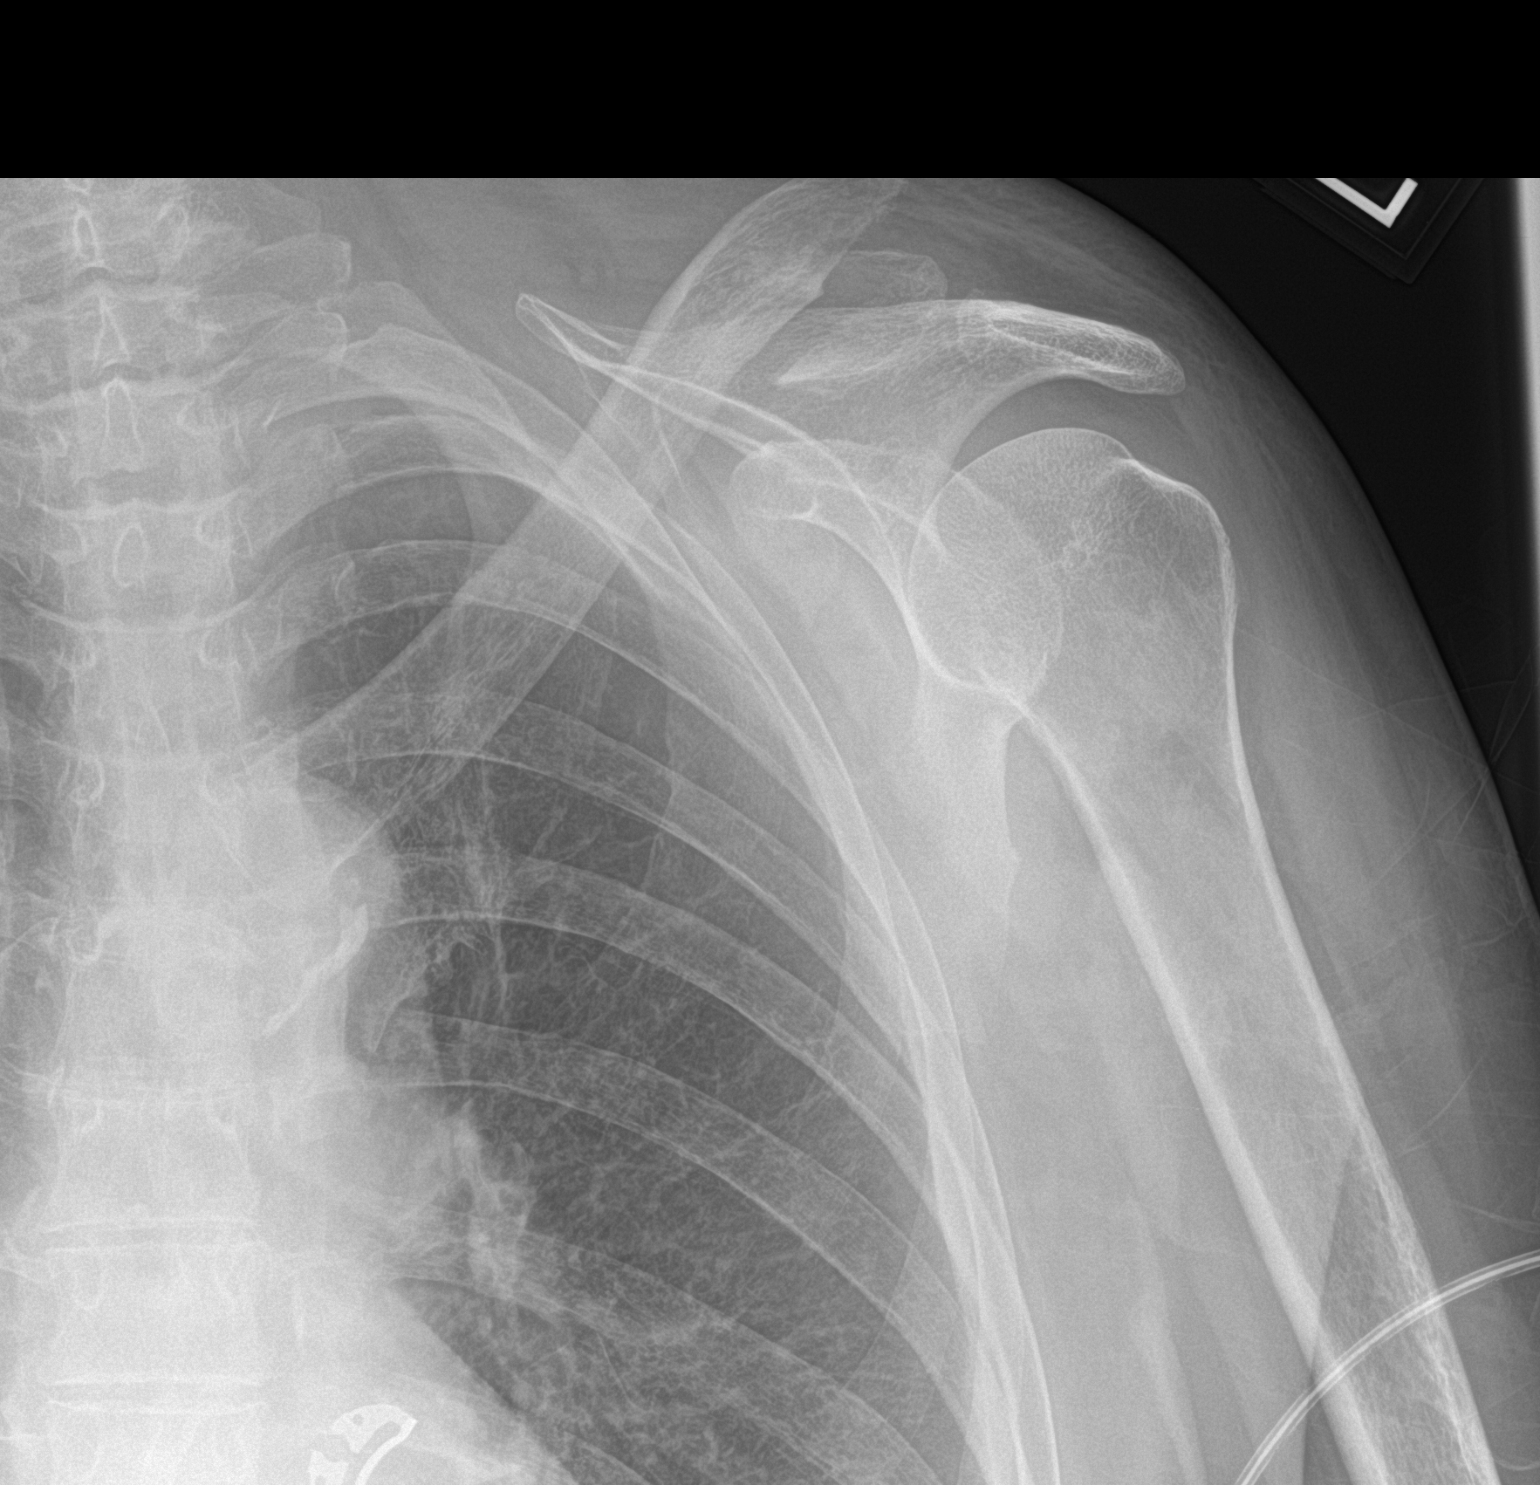

[2 of 2 positions shown; findings below may reference images not displayed]

FINDINGS: There is a displaced distal clavicular fracture. The most distal
aspect of the clavicle remains aligned with the AC joint. However,
just proximal to this, the clavicle is fractured and displaced
superiorly. The scapula and proximal humerus are normal in
appearance. Based on the transscapular Y-view, there is no
dislocation.
IMPRESSION: Displaced distal clavicular fracture.

## 2018-02-10 IMAGING — CT CT CERVICAL SPINE W/O CM
4 of 7 series · 14 of 33 positions shown, 15 images · non-contrast
Comparison: None.

CLINICAL DATA: Headache and neck pain after fall 2 days ago.

EXAM:
CT HEAD WITHOUT CONTRAST
CT CERVICAL SPINE WITHOUT CONTRAST
TECHNIQUE: Multidetector CT imaging of the head and cervical spine was
performed following the standard protocol without intravenous
contrast. Multiplanar CT image reconstructions of the cervical spine
were also generated.

[Series 7: c spine soft · axial · 0.28mm/px · z∈[-270,-174]mm · 4 of 82 slices shown]
[im 17/82  soft-tissue]
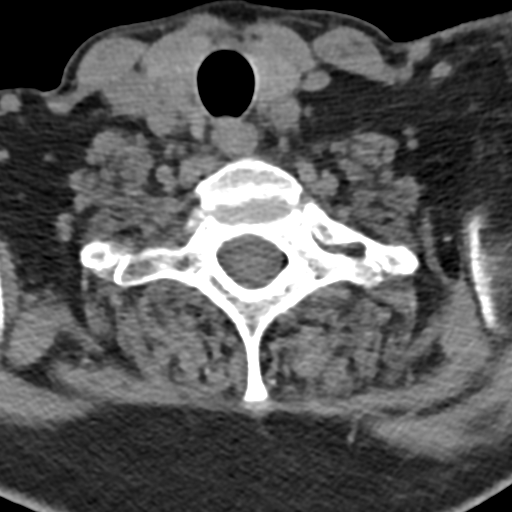
[im 33/82  soft-tissue]
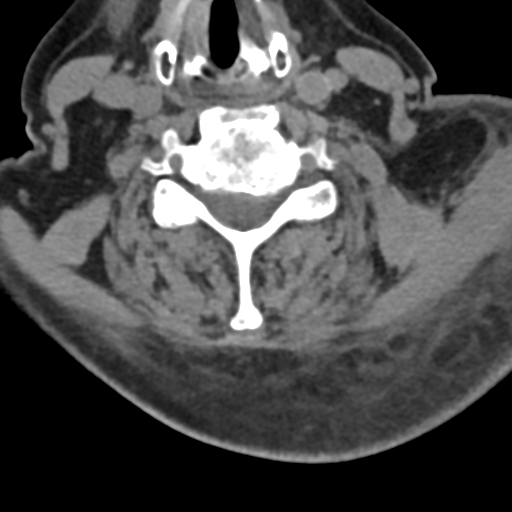
[im 49/82  soft-tissue]
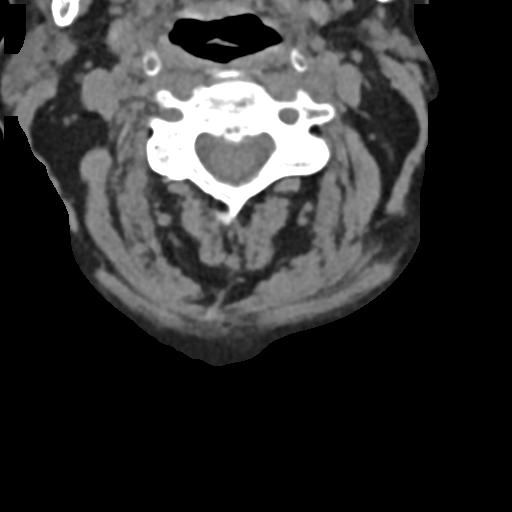
[im 65/82  soft-tissue]
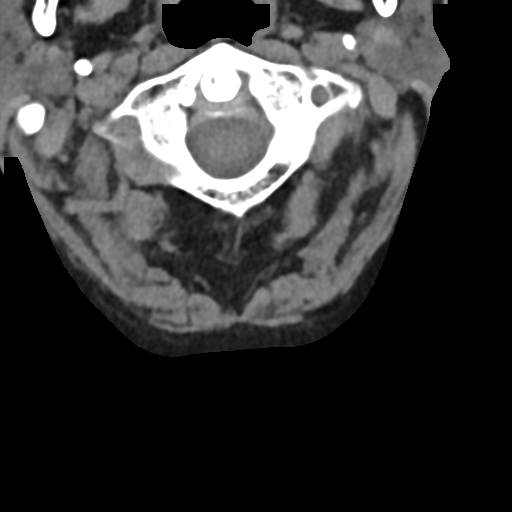

[Series 10: sagittal bone · sagittal · 0.30mm/px · 5 of 67 slices shown]
[im 12/67  bone]
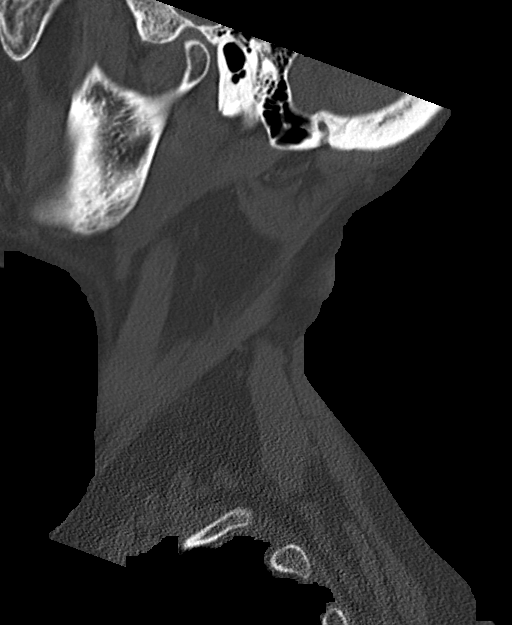
[im 23/67  bone]
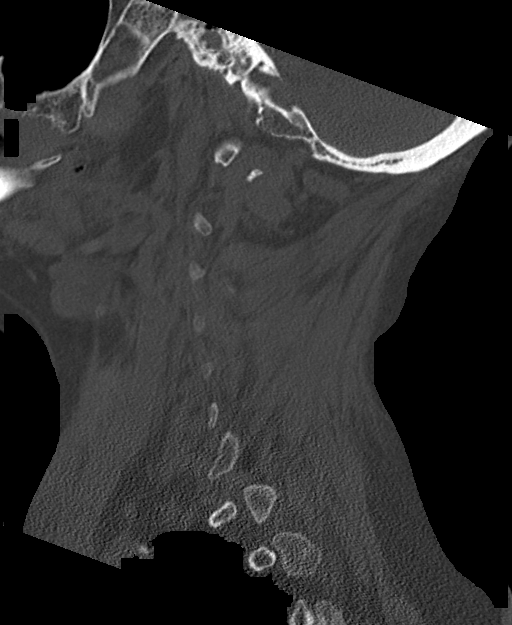
[im 34/67  bone]
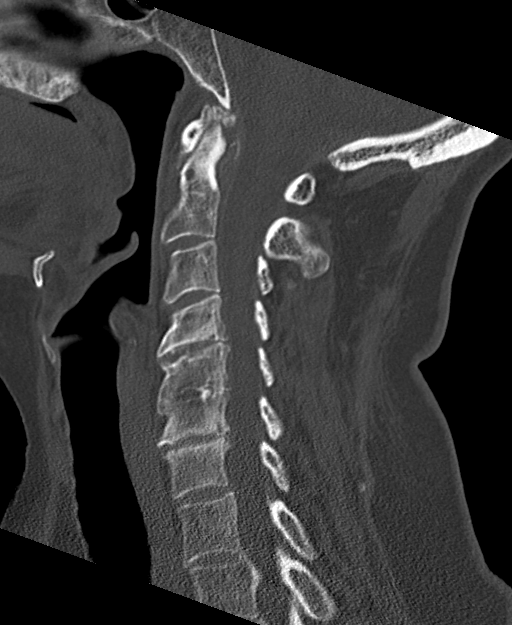
[im 45/67  bone]
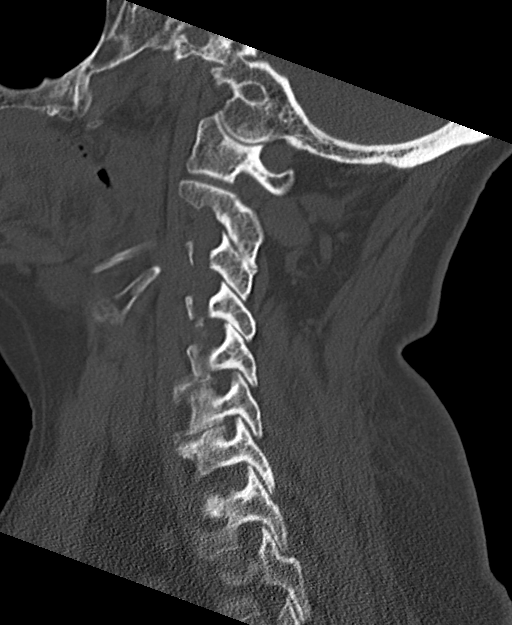
[im 56/67  bone]
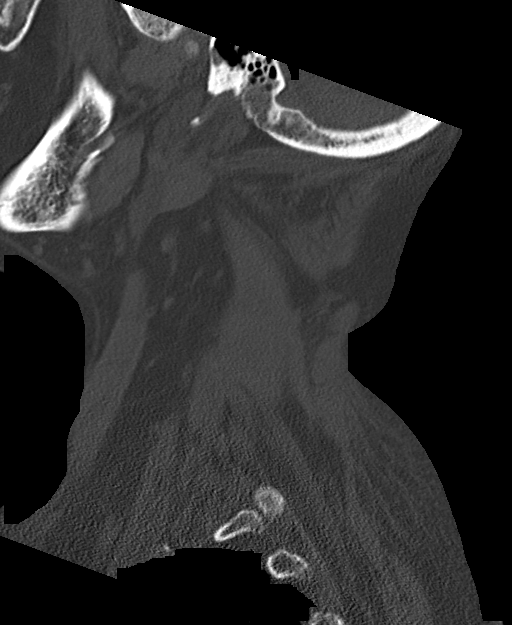

[Series 11: coronal bone · coronal · 0.26mm/px · 1 of 67 slices shown]
[im 34/67  bone]
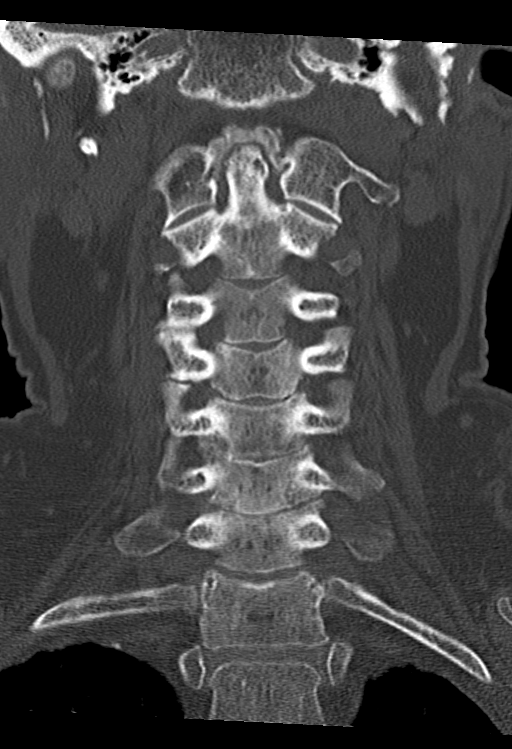

[Series 12: orthogonal bone · axial · 0.23mm/px · z∈[-302,-200]mm · 4 of 93 slices shown, 5 images]
[im 19/93  soft-tissue]
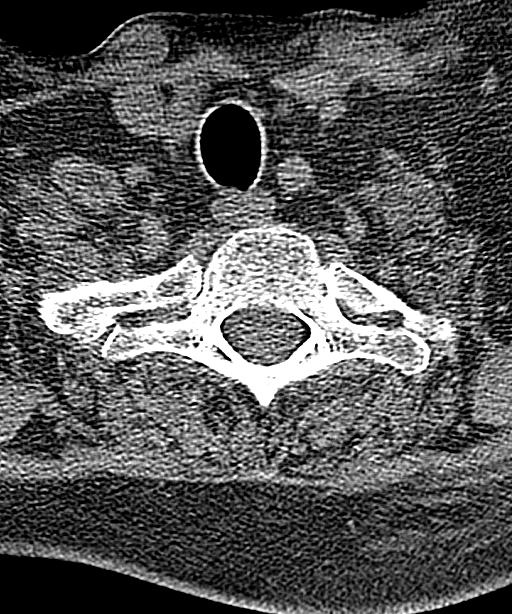
[im 19/93  bone]
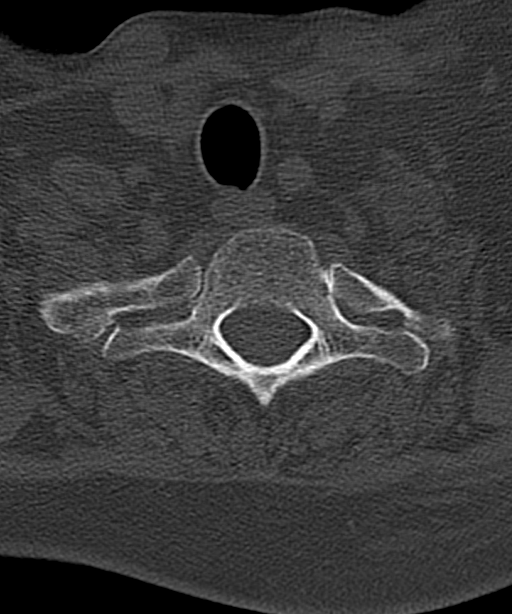
[im 37/93  bone]
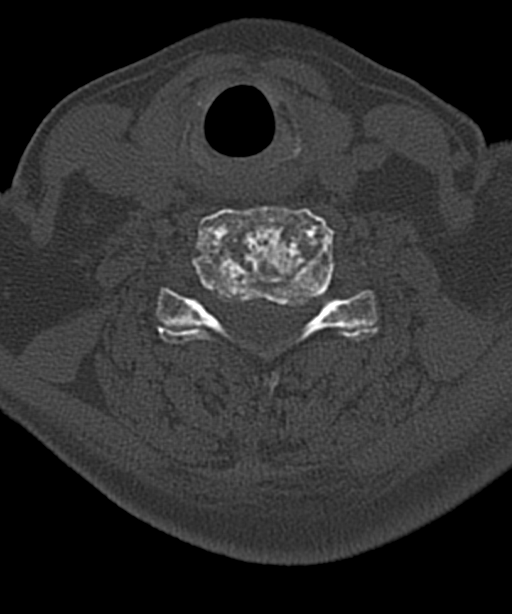
[im 56/93  bone]
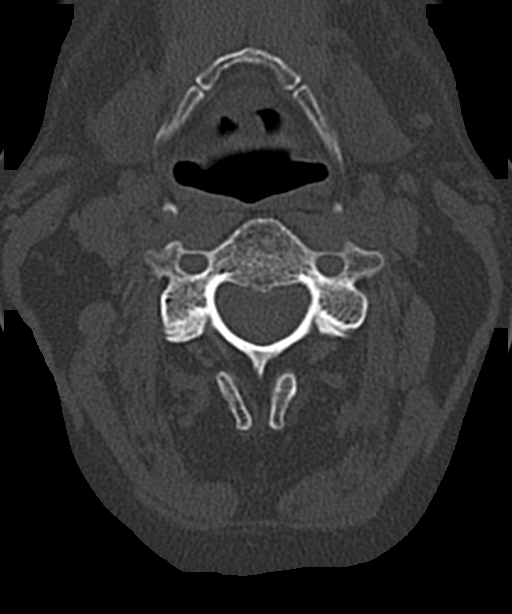
[im 74/93  bone]
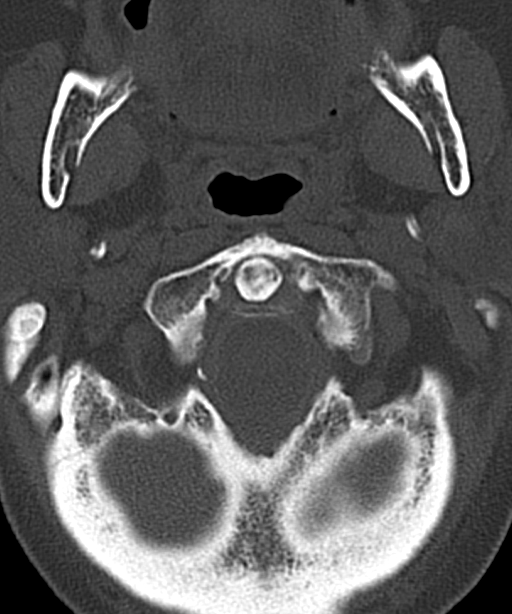

[14 of 33 positions shown; findings below may reference images not displayed]

FINDINGS: CT HEAD FINDINGS

Brain: No subdural, epidural, or subarachnoid hemorrhage. The
cerebellum, brainstem, and basal cisterns are within normal limits.
Ventricles and sulci are prominent but likely normal for age. White
matter changes are identified. No acute cortical ischemia or infarct
is seen. No mass, mass effect, or midline shift.

Vascular: Calcified atherosclerosis in the intracranial carotid
arteries.

Skull: Normal. Negative for fracture or focal lesion.

Sinuses/Orbits: No acute finding.

Other: None.

CT CERVICAL SPINE FINDINGS

Alignment: Straightening of normal lordosis with no traumatic
malalignment.

Skull base and vertebrae: No acute fracture. No primary bone lesion
or focal pathologic process.

Soft tissues and spinal canal: Fat stranding in the posterior neck
on the left may be due to the reported trauma.

Disc levels:  Multilevel degenerative changes.

Upper chest: Mild emphysematous changes in the lung apices.

Other: No other abnormalities.
IMPRESSION: 1. No acute intracranial process.
2. No fracture or traumatic malalignment in the cervical spine.

## 2018-02-21 ENCOUNTER — Telehealth: Payer: Self-pay | Admitting: Family Medicine

## 2018-02-21 NOTE — Telephone Encounter (Signed)
I left a message asking the pt to call me at [phone] to schedule AWV w/ [facility]. Last AWV 07/27/15. VDM (DD)

## 2018-06-03 ENCOUNTER — Other Ambulatory Visit: Payer: Self-pay | Admitting: Family Medicine

## 2018-06-03 DIAGNOSIS — I10 Essential (primary) hypertension: Secondary | ICD-10-CM

## 2018-06-03 NOTE — Telephone Encounter (Signed)
Please schedule for office visit with Dr. Caryn Section before more refills, she hasn't been seen > 1 year.

## 2018-06-03 NOTE — Telephone Encounter (Signed)
Patient advised as below. I offered to schedule an appointment, but she declined making an appointment right now. She states she is going to have to get back with [place] about scheduling an appointment.

## 2018-07-23 DIAGNOSIS — J90 Pleural effusion, not elsewhere classified: Secondary | ICD-10-CM | POA: Diagnosis not present

## 2018-07-23 DIAGNOSIS — R918 Other nonspecific abnormal finding of lung field: Secondary | ICD-10-CM | POA: Diagnosis not present

## 2018-07-23 DIAGNOSIS — M7989 Other specified soft tissue disorders: Secondary | ICD-10-CM | POA: Diagnosis not present

## 2018-07-23 DIAGNOSIS — Z9889 Other specified postprocedural states: Secondary | ICD-10-CM | POA: Diagnosis not present

## 2018-07-23 DIAGNOSIS — R609 Edema, unspecified: Secondary | ICD-10-CM | POA: Diagnosis not present

## 2018-07-23 DIAGNOSIS — C499 Malignant neoplasm of connective and soft tissue, unspecified: Secondary | ICD-10-CM | POA: Diagnosis not present

## 2018-09-03 ENCOUNTER — Ambulatory Visit: Payer: Self-pay | Admitting: Physician Assistant

## 2018-09-12 ENCOUNTER — Ambulatory Visit: Payer: Self-pay | Admitting: Physician Assistant

## 2019-02-08 DEATH — deceased
